# Patient Record
Sex: Female | Born: 1975
Health system: Southern US, Community
[De-identification: ages and names within clinical notes are randomized; demographics above are authoritative.]

## PROBLEM LIST (undated history)

## (undated) ENCOUNTER — Ambulatory Visit (HOSPITAL_COMMUNITY): Admission: EM | Disposition: A | Discharge status: Other Acute Inpatient Hospital | Payer: Self-pay

## (undated) DIAGNOSIS — K219 Gastro-esophageal reflux disease without esophagitis: Secondary | ICD-10-CM

## (undated) DIAGNOSIS — B1920 Unspecified viral hepatitis C without hepatic coma: Secondary | ICD-10-CM

## (undated) DIAGNOSIS — Q613 Polycystic kidney, unspecified: Secondary | ICD-10-CM

## (undated) DIAGNOSIS — M549 Dorsalgia, unspecified: Secondary | ICD-10-CM

## (undated) DIAGNOSIS — F1910 Other psychoactive substance abuse, uncomplicated: Secondary | ICD-10-CM

## (undated) DIAGNOSIS — T4145XA Adverse effect of unspecified anesthetic, initial encounter: Secondary | ICD-10-CM

## (undated) DIAGNOSIS — T8859XA Other complications of anesthesia, initial encounter: Secondary | ICD-10-CM

## (undated) DIAGNOSIS — G8929 Other chronic pain: Secondary | ICD-10-CM

## (undated) DIAGNOSIS — C539 Malignant neoplasm of cervix uteri, unspecified: Secondary | ICD-10-CM

## (undated) HISTORY — PX: NEPHRECTOMY: SHX65

---

## 1997-06-11 ENCOUNTER — Other Ambulatory Visit: Admission: RE | Admit: 1997-06-11 | Discharge: 1997-06-11 | Payer: Self-pay | Admitting: Obstetrics and Gynecology

## 1997-10-29 ENCOUNTER — Ambulatory Visit (HOSPITAL_COMMUNITY): Admission: RE | Admit: 1997-10-29 | Discharge: 1997-10-29 | Payer: Self-pay | Admitting: Obstetrics and Gynecology

## 1997-12-24 ENCOUNTER — Inpatient Hospital Stay (HOSPITAL_COMMUNITY): Admission: AD | Admit: 1997-12-24 | Discharge: 1997-12-24 | Payer: Self-pay | Admitting: Obstetrics and Gynecology

## 1998-01-01 ENCOUNTER — Inpatient Hospital Stay (HOSPITAL_COMMUNITY): Admission: AD | Admit: 1998-01-01 | Discharge: 1998-01-03 | Payer: Self-pay | Admitting: Obstetrics and Gynecology

## 1998-03-23 ENCOUNTER — Other Ambulatory Visit: Admission: RE | Admit: 1998-03-23 | Discharge: 1998-03-23 | Payer: Self-pay | Admitting: Obstetrics and Gynecology

## 2011-04-23 ENCOUNTER — Emergency Department (HOSPITAL_COMMUNITY)
Admission: EM | Admit: 2011-04-23 | Discharge: 2011-04-23 | Disposition: A | Discharge status: Home / Self Care | Payer: Self-pay | Source: Home / Self Care | Attending: Family Medicine | Admitting: Family Medicine

## 2011-04-23 ENCOUNTER — Encounter (HOSPITAL_COMMUNITY): Payer: Self-pay

## 2011-04-23 DIAGNOSIS — M5430 Sciatica, unspecified side: Secondary | ICD-10-CM

## 2011-04-23 HISTORY — DX: Malignant neoplasm of cervix uteri, unspecified: C53.9

## 2011-04-23 HISTORY — DX: Polycystic kidney, unspecified: Q61.3

## 2011-04-23 MED ORDER — TRIAMCINOLONE ACETONIDE 40 MG/ML IJ SUSP
40.0000 mg | Freq: Once | INTRAMUSCULAR | Status: AC
  Administered 2011-04-23: 40 mg via INTRAMUSCULAR

## 2011-04-23 MED ORDER — METHYLPREDNISOLONE ACETATE 80 MG/ML IJ SUSP
INTRAMUSCULAR | Status: AC
  Filled 2011-04-23: qty 1

## 2011-04-23 MED ORDER — METHYLPREDNISOLONE ACETATE 40 MG/ML IJ SUSP
80.0000 mg | Freq: Once | INTRAMUSCULAR | Status: AC
  Administered 2011-04-23: 80 mg via INTRAMUSCULAR

## 2011-04-23 MED ORDER — CYCLOBENZAPRINE HCL 5 MG PO TABS: 5.0000 mg | ORAL_TABLET | Freq: Three times a day (TID) | ORAL | Status: DC | PRN

## 2011-04-23 MED ORDER — METHYLPREDNISOLONE 4 MG PO KIT: PACK | ORAL | Status: AC

## 2011-04-23 MED ORDER — TRIAMCINOLONE ACETONIDE 40 MG/ML IJ SUSP
INTRAMUSCULAR | Status: AC
  Filled 2011-04-23: qty 5

## 2011-04-23 NOTE — ED Provider Notes (Signed)
History     CSN: [id number]  Arrival date & time 04/23/11  1244   First MD Initiated Contact with Patient 04/23/11 1337      Chief Complaint  Patient presents with  . Back Pain    lower back pain radiating down left leg    (Consider location/radiation/quality/duration/timing/severity/associated sxs/prior treatment) Patient is a 36 y.o. female presenting with back pain. The history is provided by the patient.  Back Pain  This is a new problem. The current episode started more than 1 week ago (2 week h/o pain since driving from [place].). The problem occurs constantly. The problem has been gradually worsening. The pain is present in the gluteal region and sacro-iliac joint. The quality of the pain is described as burning and shooting. The pain radiates to the left knee. The pain is moderate. The pain is the same all the time. Associated symptoms include leg pain. Pertinent negatives include no chest pain, no fever, no numbness, no abdominal pain and no dysuria.    Past Medical History  Diagnosis Date  . Polycystic kidney disease   . Cervical cancer     Past Surgical History  Procedure Date  . Nephrectomy   . Cesarean section     History reviewed. No pertinent family history.  History  Substance Use Topics  . Smoking status: Current Everyday Smoker  . Smokeless tobacco: Not on file  . Alcohol Use: Yes    OB History    Grav Para Term Preterm Abortions TAB SAB Ect Mult Living                  Review of Systems  Constitutional: Negative for fever.  Cardiovascular: Negative for chest pain.  Gastrointestinal: Negative.  Negative for abdominal pain.  Genitourinary: Negative for dysuria.  Musculoskeletal: Positive for back pain. Negative for joint swelling.  Neurological: Negative for numbness.    Allergies  Ibuprofen  Home Medications   Current Outpatient Rx  Name Route Sig Dispense Refill  . CYCLOBENZAPRINE HCL 5 MG PO TABS Oral Take 1 tablet (5 mg total) by  mouth 3 (three) times daily as needed for muscle spasms. 30 tablet 0  . METHYLPREDNISOLONE 4 MG PO KIT  follow package directions, start on Monday as directed until finished 21 tablet 0    BP 144/102  Pulse 92  Temp(Src) 97.4 F (36.3 C) (Oral)  Resp 16  SpO2 97%  Physical Exam  Nursing note and vitals reviewed. Constitutional: She is oriented to person, place, and time. She appears well-developed and well-nourished.  HENT:  Head: Normocephalic.  Abdominal: Soft. Bowel sounds are normal. She exhibits no mass. There is no tenderness. There is no rebound and no guarding.  Musculoskeletal: She exhibits tenderness.       Lumbar back: She exhibits tenderness and pain.       Back:  Neurological: She is alert and oriented to person, place, and time.  Skin: Skin is warm and dry. No rash noted.    ED Course  Procedures (including critical care time)  Labs Reviewed - No data to display No results found.   1. Sciatica neuralgia       MDM          Barkley Bruns, MD 04/23/11 1407

## 2011-04-23 NOTE — ED Notes (Signed)
Pt states she rode from [place] back to home 2 weeks ago and has had lower back pain which is getting progressively worse with pain radiating down left leg.

## 2011-05-01 ENCOUNTER — Encounter (HOSPITAL_COMMUNITY): Payer: Self-pay | Admitting: Emergency Medicine

## 2011-05-01 ENCOUNTER — Emergency Department (HOSPITAL_COMMUNITY)
Admission: EM | Admit: 2011-05-01 | Discharge: 2011-05-01 | Disposition: A | Discharge status: Home / Self Care | Payer: Self-pay | Attending: Emergency Medicine | Admitting: Emergency Medicine

## 2011-05-01 DIAGNOSIS — F17200 Nicotine dependence, unspecified, uncomplicated: Secondary | ICD-10-CM | POA: Insufficient documentation

## 2011-05-01 DIAGNOSIS — M549 Dorsalgia, unspecified: Secondary | ICD-10-CM | POA: Insufficient documentation

## 2011-05-01 DIAGNOSIS — M79609 Pain in unspecified limb: Secondary | ICD-10-CM | POA: Insufficient documentation

## 2011-05-01 DIAGNOSIS — G8929 Other chronic pain: Secondary | ICD-10-CM | POA: Insufficient documentation

## 2011-05-01 DIAGNOSIS — M545 Low back pain, unspecified: Secondary | ICD-10-CM | POA: Insufficient documentation

## 2011-05-01 HISTORY — DX: Dorsalgia, unspecified: M54.9

## 2011-05-01 HISTORY — DX: Other chronic pain: G89.29

## 2011-05-01 MED ORDER — HYDROCODONE-ACETAMINOPHEN 5-500 MG PO TABS: 1.0000 | ORAL_TABLET | Freq: Four times a day (QID) | ORAL | Status: AC | PRN

## 2011-05-01 MED ORDER — OXYCODONE-ACETAMINOPHEN 5-325 MG PO TABS
1.0000 | ORAL_TABLET | Freq: Once | ORAL | Status: AC
  Administered 2011-05-01: 1 via ORAL
  Filled 2011-05-01: qty 1

## 2011-05-01 MED ORDER — DIAZEPAM 5 MG PO TABS: 5.0000 mg | ORAL_TABLET | Freq: Three times a day (TID) | ORAL | Status: AC | PRN

## 2011-05-01 NOTE — ED Provider Notes (Signed)
History     CSN: [id number]  Arrival date & time 05/01/11  1232   First MD Initiated Contact with Patient 05/01/11 1427      Chief Complaint  Patient presents with  . Back Pain    (Consider location/radiation/quality/duration/timing/severity/associated sxs/prior treatment) Patient is a 36 y.o. female presenting with back pain. The history is provided by the patient.  Back Pain  This is a chronic problem. Episode onset: over a year ago. The problem occurs constantly. The problem has been gradually worsening. Pertinent negatives include no fever.  Pt with chronic back pain radiating into left leg. Pain for over a year. Denies recent injuries. States pain same as prior. Has been managed by pain clinic in [place]. States that she just recently moved here about 3wks ago and has no more medications left. No fever,chills, LE weakness or numbness. Was seen at [facility], given flexeril and ibuprofen, no relief.   Past Medical History  Diagnosis Date  . Polycystic kidney disease   . Cervical cancer   . Chronic back pain     Past Surgical History  Procedure Date  . Nephrectomy   . Cesarean section     History reviewed. No pertinent family history.  History  Substance Use Topics  . Smoking status: Current Everyday Smoker  . Smokeless tobacco: Not on file  . Alcohol Use: Yes    OB History    Grav Para Term Preterm Abortions TAB SAB Ect Mult Living                  Review of Systems  Constitutional: Negative for fever and chills.  HENT: Negative.   Eyes: Negative.   Respiratory: Negative.   Cardiovascular: Negative.   Genitourinary: Negative.   Musculoskeletal: Positive for back pain. Negative for joint swelling and gait problem.  Skin: Negative.   Neurological: Negative.   Psychiatric/Behavioral: Negative.     Allergies  Review of patient's allergies indicates no active allergies.  Home Medications   Current Outpatient Rx  Name Route Sig Dispense Refill  . IBUPROFEN  200 MG PO TABS Oral Take 800 mg by mouth 2 (two) times daily. For pain    . DIAZEPAM 5 MG PO TABS Oral Take 1 tablet (5 mg total) by mouth every 8 (eight) hours as needed. 15 tablet 0  . HYDROCODONE-ACETAMINOPHEN 5-500 MG PO TABS Oral Take 1-2 tablets by mouth every 6 (six) hours as needed for pain. 15 tablet 0  . METHYLPREDNISOLONE 4 MG PO KIT  follow package directions, start on Monday as directed until finished 21 tablet 0    BP 140/95  Pulse 92  Temp(Src) 97.2 F (36.2 C) (Oral)  Resp 22  SpO2 100%  Physical Exam  Nursing note and vitals reviewed. Constitutional: She is oriented to person, place, and time. She appears well-developed and well-nourished. No distress.  HENT:  Head: Normocephalic and atraumatic.  Eyes: Conjunctivae are normal.  Neck: Neck supple.  Cardiovascular: Normal rate, regular rhythm and normal heart sounds.   Pulmonary/Chest: Effort normal and breath sounds normal. No respiratory distress. She has no wheezes.  Abdominal: Soft. Bowel sounds are normal. She exhibits no distension.  Musculoskeletal: Normal range of motion.       mdline lumbar spine tenderness with palpation. Appears normal. Paraspinal tenderness. Pain with left straight leg raise.   Neurological: She is alert and oriented to person, place, and time.       2+ and equal patellar reflexes bilaterally. 5/5 and equal lower extremity  strength bilaterally. Pt able to dorsiflex bilateral feet and great toes. Normal gait  Skin: Skin is warm and dry.  Psychiatric: She has a normal mood and affect.    ED Course  Procedures (including critical care time)  Chronic pain. Spoke and received records from pt's doctors in [place]. Pt has been managed by pain clinic there, treated with Methadone  10mg , Neurontin 300mg , oxycodone 30mg , Morphine ER 30mg . Explained to pt that we will not be prescribing these medications and she should find a primary care doctor or pain specialist.   1. Chronic back pain        MDM          Lottie Mussel, PA 05/01/11 1531

## 2011-05-01 NOTE — ED Provider Notes (Signed)
Medical screening examination/treatment/procedure(s) were performed by non-physician practitioner and as supervising physician I was immediately available for consultation/collaboration.   Rolan Bucco, MD 05/01/11 [phone]

## 2011-05-01 NOTE — Discharge Instructions (Signed)
It is important that you find a doctor that will manage your pain here in [place]. Take vicodin ad valium as prescribed for pain. Follow up with specialist or a pain clinic here.   RESOURCE GUIDE  Dental Problems  Patients with Medicaid: [facility] [phone] [address]                                           [phone] [address] Phone:  [phone]                                                  Phone:  [phone]  If unable to pay or uninsured, contact:  [facility] or [facility]. to become qualified for the adult dental clinic.  Chronic Pain Problems Contact [facility]  [phone] Patients need to be referred by their primary care doctor.  Insufficient Money for Medicine Contact [employer]:  call "211" or [facility] (204)[phone].  No Primary Care Doctor Call [facility]  [phone] Other agencies that provide inexpensive medical care    [facility]  [phone]    [facility]  [phone]    [facility]  [phone]    [facility]  [phone]    [facility]  [phone]    [facility]  [phone]  Psychological Services [facility]  [phone] [facility]  [phone] [facility]   [phone] (emergency services [phone])  Substance Abuse Resources [facility]  [phone] [facility] [phone] [facility] [phone] Floydene Flock [phone] Residential & Outpatient Substance Abuse Program  [phone]  Abuse/Neglect [facility] Child Abuse Hotline 4426640727 [facility] Child Abuse Hotline 9168437078 (After Hours)  Emergency Shelter [facility] [phone]  Maternity Homes [facility] [place] [facility] 442-838-1318 [facility] [phone]  MRSA Hotline #:   7044696044    [facility]  Resources  [facility] [place]     [employer]                          [facility] [address] [place]                       [address]      [address]  [place]                                                Cristobal Goldmann Phone:  [phone]  Phone:  [phone]                 Phone:  [phone]  [facility] Phone:  [phone]  [facility] Child Abuse Hotline (806) 124-6924 (628) [phone] (After Hours)    Back Pain, Adult Low back pain is very common. About 1 in 5 people have back pain.The cause of low back pain is rarely dangerous. The pain often gets better over time.About half of people with a sudden onset of back pain feel better in just 2 weeks. About 8 in 10 people feel better by 6 weeks.  CAUSES Some common causes of back pain include:  Strain of the muscles or ligaments supporting the spine.   Wear and tear (degeneration) of the spinal discs.   Arthritis.   Direct injury to the back.  DIAGNOSIS Most of the time, the direct cause of low back pain is not known.However, back pain can be treated effectively even when the exact cause of the pain is unknown.Answering your caregiver's questions about your overall health and symptoms is one of the most accurate ways to make sure the cause of your pain is not dangerous. If your caregiver needs more information, he or she may order lab work or imaging tests (X-rays or MRIs).However, even if imaging tests show changes in your back, this usually does not require surgery. HOME CARE INSTRUCTIONS For many people, back pain returns.Since low back pain is rarely dangerous, it is often a condition that people can learn to [facility] their own.   Remain active. It is stressful on the back to sit or stand in one place. Do not sit, drive, or stand in one place for more than 30 minutes at a time. Take short  walks on level surfaces as soon as pain allows.Try to increase the length of time you walk each day.   Do not stay in bed.Resting more than 1 or 2 days can delay your recovery.   Do not avoid exercise or work.Your body is made to move.It is not dangerous to be active, even though your back may hurt.Your back will likely heal faster if you return to being active before your pain is gone.   Pay attention to your body when you bend and lift. Many people have less discomfortwhen lifting if they bend their knees, keep the load close to their bodies,and avoid twisting. Often, the most comfortable positions are those that put less stress on your recovering back.   Find a comfortable position to sleep. Use a firm mattress and lie on your side with your knees slightly bent. If you lie on your back, put a pillow under your knees.   Only take over-the-counter or prescription medicines as directed by your caregiver. Over-the-counter medicines to reduce pain and inflammation are often the most helpful.Your caregiver may prescribe muscle relaxant drugs.These medicines help dull your pain so you can more quickly return to your normal activities and healthy exercise.   Put ice on the injured area.   Put ice in a plastic bag.   Place a towel between your skin and the bag.   Leave the ice on for 15 to 20 minutes, 3 to 4 times a day for the first 2 to 3 days. After that, ice and heat may be alternated to reduce pain and spasms.   Ask your caregiver about trying back exercises and gentle massage. This may be of some benefit.   Avoid feeling anxious or stressed.Stress increases muscle tension and  can worsen back pain.It is important to recognize when you are anxious or stressed and learn ways to manage it.Exercise is a great option.  SEEK MEDICAL CARE IF:  You have pain that is not relieved with rest or medicine.   You have pain that does not improve in 1 week.   You have new symptoms.   You  are generally not feeling well.  SEEK IMMEDIATE MEDICAL CARE IF:   You have pain that radiates from your back into your legs.   You develop new bowel or bladder control problems.   You have unusual weakness or numbness in your arms or legs.   You develop nausea or vomiting.   You develop abdominal pain.   You feel faint.  Document Released: 02/27/2005 Document Revised: 11/09/2010 Document Reviewed: 07/18/2010 [facility] Patient Information 2012 [place], [place].

## 2011-05-01 NOTE — ED Notes (Signed)
Pt c/o lower back pain x several days since taking a long car ride; pt sts radiation down left leg; pt sts hx of similar

## 2014-07-24 ENCOUNTER — Observation Stay (HOSPITAL_COMMUNITY)
Admission: EM | Admit: 2014-07-24 | Discharge: 2014-07-25 | Disposition: A | Discharge status: Home / Self Care | Payer: Self-pay | Attending: Family Medicine | Admitting: Family Medicine

## 2014-07-24 ENCOUNTER — Encounter (HOSPITAL_COMMUNITY): Payer: Self-pay | Admitting: Family Medicine

## 2014-07-24 ENCOUNTER — Emergency Department (HOSPITAL_COMMUNITY): Payer: Self-pay

## 2014-07-24 DIAGNOSIS — F17210 Nicotine dependence, cigarettes, uncomplicated: Secondary | ICD-10-CM | POA: Insufficient documentation

## 2014-07-24 DIAGNOSIS — R072 Precordial pain: Principal | ICD-10-CM | POA: Diagnosis present

## 2014-07-24 DIAGNOSIS — K219 Gastro-esophageal reflux disease without esophagitis: Secondary | ICD-10-CM

## 2014-07-24 DIAGNOSIS — Z720 Tobacco use: Secondary | ICD-10-CM

## 2014-07-24 DIAGNOSIS — Z905 Acquired absence of kidney: Secondary | ICD-10-CM | POA: Insufficient documentation

## 2014-07-24 DIAGNOSIS — Z886 Allergy status to analgesic agent status: Secondary | ICD-10-CM | POA: Insufficient documentation

## 2014-07-24 DIAGNOSIS — R079 Chest pain, unspecified: Secondary | ICD-10-CM

## 2014-07-24 DIAGNOSIS — I10 Essential (primary) hypertension: Secondary | ICD-10-CM

## 2014-07-24 DIAGNOSIS — Z8541 Personal history of malignant neoplasm of cervix uteri: Secondary | ICD-10-CM | POA: Insufficient documentation

## 2014-07-24 DIAGNOSIS — R0602 Shortness of breath: Secondary | ICD-10-CM

## 2014-07-24 DIAGNOSIS — Z8249 Family history of ischemic heart disease and other diseases of the circulatory system: Secondary | ICD-10-CM | POA: Insufficient documentation

## 2014-07-24 HISTORY — DX: Gastro-esophageal reflux disease without esophagitis: K21.9

## 2014-07-24 HISTORY — DX: Adverse effect of unspecified anesthetic, initial encounter: T41.45XA

## 2014-07-24 HISTORY — DX: Unspecified viral hepatitis C without hepatic coma: B19.20

## 2014-07-24 HISTORY — DX: Other complications of anesthesia, initial encounter: T88.59XA

## 2014-07-24 LAB — BASIC METABOLIC PANEL
Anion gap: 9 (ref 5–15)
BUN: 10 mg/dL (ref 6–20)
CO2: 29 mmol/L (ref 22–32)
CREATININE: 1.05 mg/dL — AB (ref 0.44–1.00)
Calcium: 9.6 mg/dL (ref 8.9–10.3)
Chloride: 105 mmol/L (ref 101–111)
GFR calc Af Amer: >60 mL/min (ref >60)
GFR calc non Af Amer: >60 mL/min (ref >60)
Glucose, Bld: 88 mg/dL (ref 65–99)
POTASSIUM: 4.5 mmol/L (ref 3.5–5.1)
SODIUM: 143 mmol/L (ref 135–145)

## 2014-07-24 LAB — CBC WITH DIFFERENTIAL/PLATELET
Basophils Absolute: 0 10*3/uL (ref 0.0–0.1)
Basophils Relative: 0 % (ref 0–1)
EOS ABS: 0.3 10*3/uL (ref 0.0–0.7)
EOS PCT: 2 % (ref 0–5)
HCT: 44 % (ref 36.0–46.0)
Hemoglobin: 14.5 g/dL (ref 12.0–15.0)
LYMPHS PCT: 34 % (ref 12–46)
Lymphs Abs: 3.9 10*3/uL (ref 0.7–4.0)
MCH: 28.7 pg (ref 26.0–34.0)
MCHC: 33 g/dL (ref 30.0–36.0)
MCV: 87.1 fL (ref 78.0–100.0)
MONO ABS: 0.5 10*3/uL (ref 0.1–1.0)
Monocytes Relative: 4 % (ref 3–12)
Neutro Abs: 6.7 10*3/uL (ref 1.7–7.7)
Neutrophils Relative %: 60 % (ref 43–77)
Platelets: 278 10*3/uL (ref 150–400)
RBC: 5.05 MIL/uL (ref 3.87–5.11)
RDW: 14 % (ref 11.5–15.5)
WBC: 11.5 10*3/uL — AB (ref 4.0–10.5)

## 2014-07-24 LAB — I-STAT CHEM 8, ED
BUN: 16 mg/dL (ref 6–20)
CALCIUM ION: 1.14 mmol/L (ref 1.12–1.23)
CREATININE: 1.1 mg/dL — AB (ref 0.44–1.00)
Chloride: 104 mmol/L (ref 101–111)
GLUCOSE: 131 mg/dL — AB (ref 65–99)
HEMATOCRIT: 46 % (ref 36.0–46.0)
HEMOGLOBIN: 15.6 g/dL — AB (ref 12.0–15.0)
POTASSIUM: 5.3 mmol/L — AB (ref 3.5–5.1)
Sodium: 139 mmol/L (ref 135–145)
TCO2: 26 mmol/L (ref 0–100)

## 2014-07-24 LAB — PROTIME-INR
INR: 1 (ref 0.00–1.49)
Prothrombin Time: 13.3 seconds (ref 11.6–15.2)

## 2014-07-24 LAB — I-STAT TROPONIN, ED: TROPONIN I, POC: 0 ng/mL (ref 0.00–0.08)

## 2014-07-24 LAB — TROPONIN I
Troponin I: <0.03 ng/mL (ref <0.031)
Troponin I: <0.03 ng/mL (ref <0.031)

## 2014-07-24 LAB — APTT: aPTT: 30 seconds (ref 24–37)

## 2014-07-24 MED ORDER — NICOTINE 21 MG/24HR TD PT24
21.0000 mg | MEDICATED_PATCH | Freq: Every day | TRANSDERMAL | Status: DC
  Administered 2014-07-24 – 2014-07-25 (×2): 21 mg via TRANSDERMAL
  Filled 2014-07-24 (×2): qty 1

## 2014-07-24 MED ORDER — AMLODIPINE BESYLATE 5 MG PO TABS
5.0000 mg | ORAL_TABLET | Freq: Every day | ORAL | Status: DC
  Administered 2014-07-24: 5 mg via ORAL
  Filled 2014-07-24 (×2): qty 1

## 2014-07-24 MED ORDER — PANTOPRAZOLE SODIUM 40 MG PO TBEC
40.0000 mg | DELAYED_RELEASE_TABLET | Freq: Every day | ORAL | Status: DC
  Administered 2014-07-24 – 2014-07-25 (×2): 40 mg via ORAL
  Filled 2014-07-24: qty 1

## 2014-07-24 MED ORDER — ACETAMINOPHEN 650 MG RE SUPP: 650.0000 mg | Freq: Four times a day (QID) | RECTAL | Status: DC | PRN

## 2014-07-24 MED ORDER — METHADONE HCL 5 MG PO TABS: 220.0000 mg | ORAL_TABLET | Freq: Every day | ORAL | Status: DC

## 2014-07-24 MED ORDER — ACETAMINOPHEN 325 MG PO TABS
650.0000 mg | ORAL_TABLET | Freq: Four times a day (QID) | ORAL | Status: DC | PRN
  Administered 2014-07-24 (×2): 650 mg via ORAL
  Filled 2014-07-24 (×2): qty 2

## 2014-07-24 MED ORDER — METOPROLOL TARTRATE 1 MG/ML IV SOLN
5.0000 mg | Freq: Once | INTRAVENOUS | Status: AC
  Administered 2014-07-24: 5 mg via INTRAVENOUS
  Filled 2014-07-24: qty 5

## 2014-07-24 MED ORDER — MORPHINE SULFATE 2 MG/ML IJ SOLN: 2.0000 mg | INTRAMUSCULAR | Status: DC | PRN

## 2014-07-24 MED ORDER — LISINOPRIL 10 MG PO TABS
10.0000 mg | ORAL_TABLET | Freq: Once | ORAL | Status: AC
  Administered 2014-07-24: 10 mg via ORAL
  Filled 2014-07-24: qty 1

## 2014-07-24 MED ORDER — ONDANSETRON HCL 4 MG/2ML IJ SOLN: 4.0000 mg | Freq: Four times a day (QID) | INTRAMUSCULAR | Status: DC | PRN

## 2014-07-24 MED ORDER — ASPIRIN EC 325 MG PO TBEC
325.0000 mg | DELAYED_RELEASE_TABLET | Freq: Every day | ORAL | Status: DC
  Administered 2014-07-25: 325 mg via ORAL
  Filled 2014-07-24: qty 1

## 2014-07-24 MED ORDER — SODIUM CHLORIDE 0.9 % IV SOLN
Freq: Once | INTRAVENOUS | Status: AC
  Administered 2014-07-24: 11:00:00 via INTRAVENOUS

## 2014-07-24 MED ORDER — SODIUM CHLORIDE 0.9 % IJ SOLN
3.0000 mL | Freq: Two times a day (BID) | INTRAMUSCULAR | Status: DC
  Administered 2014-07-24 – 2014-07-25 (×3): 3 mL via INTRAVENOUS

## 2014-07-24 MED ORDER — GI COCKTAIL ~~LOC~~
30.0000 mL | Freq: Once | ORAL | Status: AC
  Administered 2014-07-24: 30 mL via ORAL
  Filled 2014-07-24: qty 30

## 2014-07-24 MED ORDER — ENOXAPARIN SODIUM 40 MG/0.4ML ~~LOC~~ SOLN
40.0000 mg | SUBCUTANEOUS | Status: DC
  Administered 2014-07-24: 40 mg via SUBCUTANEOUS
  Filled 2014-07-24 (×2): qty 0.4

## 2014-07-24 MED ORDER — METHADONE HCL 10 MG PO TABS
220.0000 mg | ORAL_TABLET | Freq: Every day | ORAL | Status: DC
  Administered 2014-07-25: 220 mg via ORAL
  Filled 2014-07-24: qty 22

## 2014-07-24 MED ORDER — NITROGLYCERIN 0.4 MG SL SUBL
0.4000 mg | SUBLINGUAL_TABLET | SUBLINGUAL | Status: DC | PRN
  Administered 2014-07-24 (×2): 0.4 mg via SUBLINGUAL
  Filled 2014-07-24: qty 1

## 2014-07-24 MED ORDER — ONDANSETRON HCL 4 MG PO TABS: 4.0000 mg | ORAL_TABLET | Freq: Four times a day (QID) | ORAL | Status: DC | PRN

## 2014-07-24 MED ORDER — ASPIRIN 81 MG PO CHEW
324.0000 mg | CHEWABLE_TABLET | Freq: Once | ORAL | Status: AC
  Administered 2014-07-24: 324 mg via ORAL
  Filled 2014-07-24: qty 4

## 2014-07-24 MED ORDER — METHADONE HCL 10 MG/ML PO CONC: 220.0000 mg | Freq: Every day | ORAL | Status: DC

## 2014-07-24 NOTE — H&P (Signed)
History and Physical  Felicia Hendricks HAL:[id number] DOB: 02-23-1976 DOA: 07/24/2014   PCP: Pcp Not In System  Referring Physician: ED/ Dr. 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 The patient describes the pain as a pressure sensation on the substernal and left-sided chest area. It is associated with mostly rest and occasionally with activity. She denies any shortness of breath, dizziness, syncope, diaphoresis. She has had some intermittent nausea. The patient became concerned when she began having radiation down to her left arm and having numbness and tingling in her bilateral hands. She went to see her primary care physician on 07/23/2014, and at that time her systolic blood pressure was noted in the 240s. The patient was told to go to the emergency department, but refused at the time. Because of persistent chest discomfort, she presented to the emergency department on 07/24/2014. At the time of presentation, the patient was noted to have a blood pressure of 201/102. The patient was given sublingual nitroglycerin 2 and intravenous metoprolol with improvement of her blood pressure as well as her chest discomfort. The patient is afebrile and hemodynamically stable with oxygen saturation 97-100 percent on room air. She continues to smoke 1-1/2 packs per day. She states that her dad had myocardial infarctions in his early 75s. She denies any current illicit drug use. She receives daily methadone from [place]. In addition, the patient has been taking up to 16 pills of ibuprofen daily for the past 2-3 weeks. In emergency department, the patient was afebrile and dynamically stable. WBC was 11.5 with hemoglobin 14.5, platelets 278,000. Point of care troponin show potassium 5.3, creatinine 1.10. EKG showed sinus rhythm with  nonspecific ST changes. Chest x-ray was negative for any acute findings. Assessment/Plan: Atypical chest pain -The patient's chest pain is atypical by clinical history -Likely also contributed by her uncontrolled blood pressure -HEART score is 3 -EKG without concerning ischemic changes -Cycle troponins -Also suspect that her atypical chest pain may be related to a GI source as the patient has taken excessive amounts of ibuprofen and using over-the-counter acid reducer- Hypertensive urgency -The patient was previously diagnosed with hypertension over 10 years ago and started on lisinopril -Stated that when she had nephrectomy her blood pressure improved and stopped taking lisinopril -Has not had physician follow-up for many years. -Start amlodipine -If urine drug screen is negative for illicit substances, patient can be restarted on beta blocker -Check urine drug screen The patient's elevated blood pressure may also been achievable to her excessive ibuprofen use in recent weeks- Tobacco abuse -NicoDerm patch -Cessation discussed Questionable hyperkalemia -This was noted on a point of care BMP which may intermittently be inaccurate--will recheck -check serum BMP History of heroin use -Continue methadone dosing GERD -Patient was taking over-the-counter acid reducer -Give the patient GI cocktail and observe for clinical response  -Start PPI  history of nephrectomy  -Secondary to polycystic kidney disease        Past Medical History  Diagnosis Date  . Polycystic kidney disease   . Cervical cancer   . Chronic back pain   . Hepatitis C    Past Surgical History  Procedure Laterality Date  . Nephrectomy    . Cesarean section     Social History:  reports that she has been smoking.  She does not have  any smokeless tobacco history on file. She reports that she drinks alcohol. She reports that she does not use illicit drugs.   History reviewed. No pertinent family  history.Father is deceased from heart failure--had myocardial infarction in the early 58s. Mother had myocardial infarction at age 70   Allergies  Allergen Reactions  . Aspirin     Kidney disease       Prior to Admission medications   Medication Sig Start Date End Date Taking? Authorizing Provider  ibuprofen (ADVIL,MOTRIN) 200 MG tablet Take 800 mg by mouth 2 (two) times daily. Take every day per patient For pain   Yes Historical Provider, MD  METHADONE HCL PO Take 1 tablet by mouth daily. 220mg    Yes Historical Provider, MD  oxymetazoline (AFRIN) 0.05 % nasal spray Place 1 spray into both nostrils 2 (two) times daily as needed for congestion.   Yes Historical Provider, MD    Review of Systems:  Constitutional:  No weight loss, night sweats, Fevers, chills, fatigue.  Head&Eyes: No headache.  No vision loss.  No eye pain or scotoma ENT:  No Difficulty swallowing,Tooth/dental problems,Sore throat,   Cardio-vascular:  No Orthopnea, PND, swelling in lower extremities,  dizziness, palpitations  GI:  No  abdominal pain,diarrhea, loss of appetite, hematochezia, melena, heartburn, indigestion, Resp:  No shortness of breath with exertion or at rest. No cough. No coughing up of blood .No wheezing.No chest wall deformity  Skin:  no rash or lesions.  GU:  no dysuria, change in color of urine, no urgency or frequency. No flank pain.  Musculoskeletal:  No joint pain or swelling. No decreased range of motion. No back pain.  Psych:  No change in mood or affect. No depression or anxiety. Neurologic: No  no dysesthesia, no focal weakness, no vision loss. No syncope  Physical Exam: Filed Vitals:   07/24/14 1200 07/24/14 1215 07/24/14 1224 07/24/14 1230  BP: 149/89 191/100 176/97 182/99  Pulse: 48 48 50 54  Temp:      Resp: 14 10 14 12   Height:      Weight:      SpO2: 97% 98% 98% 99%   General:  A&O x 3, NAD, nontoxic, pleasant/cooperative Head/Eye: No conjunctival hemorrhage, no  icterus, [place]/AT, No nystagmus ENT:  No icterus,  No thrush, good dentition, no pharyngeal exudate Neck:  No masses, no lymphadenpathy, no bruits CV:  RRR, no rub, no gallop, no S3 Lung:  CTAB, good air movement, no wheeze, no rhonchi Abdomen: soft/NT, +BS, nondistended, no peritoneal signs Ext: No cyanosis, No rashes, No petechiae, No lymphangitis, No edema   Labs on Admission:  Basic Metabolic Panel:  Recent Labs Lab 07/24/14 1039  NA 139  K 5.3*  CL 104  GLUCOSE 131*  BUN 16  CREATININE 1.10*   Liver Function Tests: No results for input(s): AST, ALT, ALKPHOS, BILITOT, PROT, ALBUMIN in the last 168 hours. No results for input(s): LIPASE, AMYLASE in the last 168 hours. No results for input(s): AMMONIA in the last 168 hours. CBC:  Recent Labs Lab 07/24/14 1030 07/24/14 1039  WBC 11.5*  --   NEUTROABS 6.7  --   HGB 14.5 15.6*  HCT 44.0 46.0  MCV 87.1  --   PLT 278  --    Cardiac Enzymes: No results for input(s): CKTOTAL, CKMB, CKMBINDEX, TROPONINI in the last 168 hours. BNP: Invalid input(s): POCBNP CBG: No results for input(s): GLUCAP in the last 168 hours.  Radiological Exams on Admission: Dg Chest 2  View  07/24/2014   CLINICAL DATA:  Chest pain.  history of cervical cancer hepatitis-C  EXAM: CHEST  2 VIEW  COMPARISON:  None.  FINDINGS: Normal mediastinum and cardiac silhouette. Normal pulmonary vasculature. No evidence of effusion, infiltrate, or pneumothorax. Compression deformity of the superior endplate of the L1 vertebral body with approximately 25% loss of vertebral body height.  IMPRESSION: 1.  No acute cardiopulmonary process. 2. Age-indeterminate endplate compression fracture at L1.  AA   Electronically Signed   By: Suzy Bouchard M.D.   On: 07/24/2014 11:46    EKG: Independently reviewed. Sinus rhythm, nonspecific ST changes    Time spent:60 minutes Code Status:   FULL Family Communication:   Aunt at bedside   Netta Fodge, DO  [facility] Pager [phone]  If 7PM-7AM, please contact night-coverage [email or web address] Password [facility] 07/24/2014, 12:42 PM

## 2014-07-24 NOTE — ED Provider Notes (Signed)
CSN: [id number]     Arrival date & time 07/24/14  4854 History   First MD Initiated Contact with Patient 07/24/14 1005     Chief Complaint  Patient presents with  . Hypertension  . 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 She was hypertensive with her pregnancies in the past, she has avoided physicians but recently at the methadone clinic she was told she needed to see the doctor because of persistent hypertension, she went to the doctor last night because generally at the insistence of her mother, they told her to come to the hospital immediately for evaluation due to severe persistent hypertension and ongoing chest pain. She describes her chest pain as a heaviness, middle of the chest, left side of the chest with radiation to the left shoulder, this is sometimes exertional but sometimes it comes on her breast, currently it is mild and not associated with shortness of breath nausea vomiting diaphoresis or swelling of the lower extremities. She has not been taking any blood pressure medications, she takes over-the-counter ibuprofen as well as over-the-counter antacid medications. She still smokes cigarettes, there is a strong family history of heart disease. She has never been evaluated for any chest pain in the past. She did not go to the emergency department last night, she waited until this morning and at the insistence of her mother presents for evaluation.  She reports blood pressures of between 240 and 260/120 yesterday.  Patient is a 39 y.o. female presenting with hypertension and chest pain. The history is provided by the patient and a relative.  Hypertension Associated symptoms include chest pain.  Chest  Pain   Past Medical History  Diagnosis Date  . Polycystic kidney disease   . Cervical cancer   . Chronic back pain   . Hepatitis C    Past Surgical History  Procedure Laterality Date  . Nephrectomy    . Cesarean section     History reviewed. No pertinent family history. History  Substance Use Topics  . Smoking status: Current Every Day Smoker  . Smokeless tobacco: Not on file  . Alcohol Use: Yes   OB History    No data available     Review of Systems  Cardiovascular: Positive for chest pain.  All other systems reviewed and are negative.     Allergies  Aspirin  Home Medications   Prior to Admission medications   Medication Sig Start Date End Date Taking? Authorizing Provider  ibuprofen (ADVIL,MOTRIN) 200 MG tablet Take 800 mg by mouth 2 (two) times daily. Take every day per patient For pain   Yes Historical Provider, MD  METHADONE HCL PO Take 1 tablet by mouth daily. 220mg    Yes Historical Provider, MD  oxymetazoline (AFRIN) 0.05 % nasal spray Place 1 spray into both nostrils 2 (two) times daily as needed for congestion.   Yes Historical Provider, MD   BP 138/106 mmHg  Pulse 52  Temp(Src) 97.6 F (36.4 C)  Resp 14  Ht 5\' 5"  (1.651 m)  Wt 210 lb (95.255 kg)  BMI 34.95 kg/m2  SpO2 97% Physical Exam  Constitutional: She appears well-developed and well-nourished. No distress.  HENT:  Head: Normocephalic and atraumatic.  Mouth/Throat: Oropharynx is clear and moist. No oropharyngeal exudate.  Eyes: Conjunctivae and  EOM are normal. Pupils are equal, round, and reactive to light. Right eye exhibits no discharge. Left eye exhibits no discharge. No scleral icterus.  Neck: Normal range of motion. Neck supple. No JVD present. No thyromegaly present.  Cardiovascular: Normal rate, regular rhythm, normal heart sounds and intact distal pulses.  Exam reveals no gallop and no friction rub.   No murmur heard. Pulmonary/Chest: Effort normal and breath sounds normal. No  respiratory distress. She has no wheezes. She has no rales.  Abdominal: Soft. Bowel sounds are normal. She exhibits no distension and no mass. There is no tenderness.  Musculoskeletal: Normal range of motion. She exhibits no edema or tenderness.  Lymphadenopathy:    She has no cervical adenopathy.  Neurological: She is alert. Coordination normal.  Skin: Skin is warm and dry. No rash noted. No erythema.  Psychiatric: She has a normal mood and affect. Her behavior is normal.  Nursing note and vitals reviewed.   ED Course  Procedures (including critical care time) Labs Review Labs Reviewed  CBC WITH DIFFERENTIAL/PLATELET - Abnormal; Notable for the following:    WBC 11.5 (*)    All other components within normal limits  I-STAT CHEM 8, ED - Abnormal; Notable for the following:    Potassium 5.3 (*)    Creatinine, Ser 1.10 (*)    Glucose, Bld 131 (*)    Hemoglobin 15.6 (*)    All other components within normal limits  PROTIME-INR  APTT  I-STAT TROPOININ, ED    Imaging Review Dg Chest 2 View  07/24/2014   CLINICAL DATA:  Chest pain.  history of cervical cancer hepatitis-C  EXAM: CHEST  2 VIEW  COMPARISON:  None.  FINDINGS: Normal mediastinum and cardiac silhouette. Normal pulmonary vasculature. No evidence of effusion, infiltrate, or pneumothorax. Compression deformity of the superior endplate of the L1 vertebral body with approximately 25% loss of vertebral body height.  IMPRESSION: 1.  No acute cardiopulmonary process. 2. Age-indeterminate endplate compression fracture at L1.  AA   Electronically Signed   By: Suzy Bouchard M.D.   On: 07/24/2014 11:46     EKG Interpretation   Date/Time:  Friday Jul 24 2014 10:43:17 EDT Ventricular Rate:  68 PR Interval:  154 QRS Duration: 73 QT Interval:  457 QTC Calculation: 486 R Axis:   24 Text Interpretation:  Sinus rhythm Consider left atrial enlargement  Probable anterolateral infarct, old Abnormal ekg No old tracing to compare   Confirmed by Saif Peter  MD, [place] ([id number]) on 07/24/2014 10:56:59 AM      MDM   Final diagnoses:  SOB (shortness of breath)  Essential hypertension  Chest pain, unspecified chest pain type    At this time the patient's blood pressure is severely elevated. Her heart and lung sounds are normal, she has no peripheral edema, she has a solitary left kidney, we'll check renal function, evaluate for cardiac involvement related to blood pressure and chest pain, anticipate admission to the hospital due to severe uncontrolled hypertension and ongoing chest pain. She has multiple risk factors for heart disease. EKG from the office from last night was reviewed, no signs of acute ischemia at that time.  Labs neg, BP improved with meds as below - needs CP r/o  Meds given in ED:  Medications  nitroGLYCERIN (NITROSTAT) SL tablet 0.4 mg (0.4 mg Sublingual Given 07/24/14 1055)  lisinopril (PRINIVIL,ZESTRIL) tablet 10 mg (10 mg Oral Given 07/24/14 1040)  metoprolol (LOPRESSOR) injection 5 mg (5 mg Intravenous Given 07/24/14 1047)  aspirin  chewable tablet 324 mg (324 mg Oral Given 07/24/14 1040)  0.9 %  sodium chloride infusion ( Intravenous New Bag/Given 07/24/14 1047)    New Prescriptions   No medications on file       Noemi Chapel, MD 07/24/14 1202

## 2014-07-24 NOTE — ED Notes (Addendum)
Pt refusing 3rd NTG SL d/t headache.  MD Sabra Heck aware.

## 2014-07-24 NOTE — Progress Notes (Signed)
Noted pt's HR 41 SB.  Pt asymptomatic. Notified  Dr. Carles Collet .  Instructed to obtain VS BP 144/95, p48.  And also EKG - marked SB,.  Cannot rule out inferior infarct, age undetermined.    Dr. Carles Collet made aware.  Will continue to monitor.

## 2014-07-24 NOTE — ED Notes (Signed)
Attempted report 

## 2014-07-24 NOTE — ED Notes (Signed)
Pt here for weeks of hypertension, chest pain. Sent here by doctor for further work up.

## 2014-07-24 NOTE — Progress Notes (Signed)
At 1300 pt arrived from ED via stretcher to floor.  Denies pain/discomfort.  A&0x4.  Family members at her side.  Oriented to surroundings.  Call bell at reach.  Instructed to call for assistance.  Verbalized understanding.  Will continue to monitor.  Karie Kirks, Therapist, sports.

## 2014-07-25 DIAGNOSIS — R0789 Other chest pain: Secondary | ICD-10-CM

## 2014-07-25 LAB — BASIC METABOLIC PANEL
Anion gap: 10 (ref 5–15)
BUN: 17 mg/dL (ref 6–20)
CHLORIDE: 104 mmol/L (ref 101–111)
CO2: 25 mmol/L (ref 22–32)
CREATININE: 1 mg/dL (ref 0.44–1.00)
Calcium: 8.7 mg/dL — ABNORMAL LOW (ref 8.9–10.3)
Glucose, Bld: 117 mg/dL — ABNORMAL HIGH (ref 65–99)
POTASSIUM: 3.8 mmol/L (ref 3.5–5.1)
Sodium: 139 mmol/L (ref 135–145)

## 2014-07-25 LAB — CBC
HCT: 37.3 % (ref 36.0–46.0)
HEMOGLOBIN: 12.5 g/dL (ref 12.0–15.0)
MCH: 29.1 pg (ref 26.0–34.0)
MCHC: 33.5 g/dL (ref 30.0–36.0)
MCV: 86.9 fL (ref 78.0–100.0)
PLATELETS: 251 10*3/uL (ref 150–400)
RBC: 4.29 MIL/uL (ref 3.87–5.11)
RDW: 14.2 % (ref 11.5–15.5)
WBC: 9.5 10*3/uL (ref 4.0–10.5)

## 2014-07-25 LAB — TROPONIN I: Troponin I: <0.03 ng/mL (ref <0.031)

## 2014-07-25 MED ORDER — AMLODIPINE BESYLATE 10 MG PO TABS
10.0000 mg | ORAL_TABLET | Freq: Every day | ORAL | Status: DC
  Administered 2014-07-25: 10 mg via ORAL
  Filled 2014-07-25: qty 1

## 2014-07-25 MED ORDER — PANTOPRAZOLE SODIUM 40 MG PO TBEC: 40.0000 mg | DELAYED_RELEASE_TABLET | Freq: Every day | ORAL | Status: DC

## 2014-07-25 MED ORDER — AMLODIPINE BESYLATE 10 MG PO TABS: 10.0000 mg | ORAL_TABLET | Freq: Every day | ORAL | Status: DC

## 2014-07-25 NOTE — Discharge Summary (Signed)
Physician Discharge Summary  Felicia Hendricks BHA:[id number] DOB: 1975/10/12 DOA: 07/24/2014  PCP: Pcp Not In System  Admit date: 07/24/2014 Discharge date: 07/25/2014  Time spent: > 35 minutes  Recommendations for Outpatient Follow-up:  1.  Monitor blood pressures and adjust antihypertensives medications 2. 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  Hospital Course:   Chest discomfort - Patient states she has a strong history of GERD. She tries to take her medications daily. I suspect GERD is most likely cause of current chest discomfort. Her troponins have been negative - No chest pain currently. I have educated patient on diet and other causes of things that can make GERD worse. Have advised to avoid such things like chocolate and tobacco use - Will discharge on Protonix and provide prescription  Hypertension - Improved control on amlodipine but will increase to 10 mg by mouth daily on discharge. - I have recommended patient discontinue nonsteroidal anti-inflammatory use  Procedures:  none Consultations:  * None*  Discharge Exam: Filed Vitals:   07/25/14 0621  BP: 173/82  Pulse: 55  Temp: 97.5 F (36.4 C)  Resp: 18    General: Pt in nad, alert and awake Cardiovascular: rrr, no mrg Respiratory: cta bl, no wheezes  Discharge Instructions   Discharge Instructions    Call MD for:  redness,  tenderness, or signs of infection (pain, swelling, redness, odor or green/yellow discharge around incision site)    Complete by:  As directed      Call MD for:  severe uncontrolled pain    Complete by:  As directed      Call MD for:  temperature >100.4    Complete by:  As directed      Diet - low sodium heart healthy    Complete by:  As directed      Discharge instructions    Complete by:  As directed   Please be sure to follow-up with her primary care physician within one or 2 weeks for further evaluation recommendations. Please avoid taking any nonsteroidal anti-inflammatories He will be discharged with new antihypertensive medication amlodipine. Please be sure to have your blood pressure rechecked within the next week and follow-up with your primary care physician for further evaluation recommendations.     Increase activity slowly    Complete by:  As directed           Current Discharge Medication List    START taking these medications   Details  amLODipine (NORVASC) 10 MG tablet Take 1 tablet (10 mg total) by mouth at bedtime. Qty: 30 tablet, Refills: 0    pantoprazole (PROTONIX) 40 MG tablet Take 1 tablet (40 mg total) by mouth daily. Qty: 30 tablet, Refills: 0      CONTINUE these medications which have NOT CHANGED   Details  METHADONE HCL PO Take 1 tablet by mouth daily. 220mg       STOP taking  these medications     ibuprofen (ADVIL,MOTRIN) 200 MG tablet      oxymetazoline (AFRIN) 0.05 % nasal spray        Allergies  Allergen Reactions  . Aspirin     Kidney disease       The results of significant diagnostics from this hospitalization (including imaging, microbiology, ancillary and laboratory) are listed below for reference.    Significant Diagnostic Studies: Dg Chest 2 View  07/24/2014   CLINICAL DATA:  Chest pain.  history of cervical cancer hepatitis-C  EXAM: CHEST  2 VIEW  COMPARISON:  None.  FINDINGS: Normal mediastinum and cardiac silhouette. Normal  pulmonary vasculature. No evidence of effusion, infiltrate, or pneumothorax. Compression deformity of the superior endplate of the L1 vertebral body with approximately 25% loss of vertebral body height.  IMPRESSION: 1.  No acute cardiopulmonary process. 2. Age-indeterminate endplate compression fracture at L1.  AA   Electronically Signed   By: Suzy Bouchard M.D.   On: 07/24/2014 11:46    Microbiology: No results found for this or any previous visit (from the past 240 hour(s)).   Labs: Basic Metabolic Panel:  Recent Labs Lab 07/24/14 1039 07/24/14 1455 07/25/14 0313  NA 139 143 139  K 5.3* 4.5 3.8  CL 104 105 104  CO2  --  29 25  GLUCOSE 131* 88 117*  BUN 16 10 17   CREATININE 1.10* 1.05* 1.00  CALCIUM  --  9.6 8.7*   Liver Function Tests: No results for input(s): AST, ALT, ALKPHOS, BILITOT, PROT, ALBUMIN in the last 168 hours. No results for input(s): LIPASE, AMYLASE in the last 168 hours. No results for input(s): AMMONIA in the last 168 hours. CBC:  Recent Labs Lab 07/24/14 1030 07/24/14 1039 07/25/14 0313  WBC 11.5*  --  9.5  NEUTROABS 6.7  --   --   HGB 14.5 15.6* 12.5  HCT 44.0 46.0 37.3  MCV 87.1  --  86.9  PLT 278  --  251   Cardiac Enzymes:  Recent Labs Lab 07/24/14 1455 07/24/14 2038 07/25/14 0200  TROPONINI <0.03 <0.03 <0.03   BNP: BNP (last 3 results) No results for input(s): BNP in the last 8760 hours.  ProBNP (last 3 results) No results for input(s): PROBNP in the last 8760 hours.  CBG: No results for input(s): GLUCAP in the last 168 hours.     Signed:  Velvet Bathe  [facility] 07/25/2014, 2:55 PM

## 2015-10-15 ENCOUNTER — Ambulatory Visit (HOSPITAL_COMMUNITY)
Admission: RE | Admit: 2015-10-15 | Discharge: 2015-10-15 | Disposition: A | Discharge status: Home / Self Care | Payer: Self-pay | Source: Ambulatory Visit | Attending: Emergency Medicine | Admitting: Emergency Medicine

## 2015-10-15 DIAGNOSIS — R001 Bradycardia, unspecified: Secondary | ICD-10-CM | POA: Insufficient documentation

## 2015-10-15 DIAGNOSIS — R079 Chest pain, unspecified: Secondary | ICD-10-CM | POA: Insufficient documentation

## 2016-07-14 ENCOUNTER — Emergency Department (HOSPITAL_COMMUNITY)
Admission: EM | Admit: 2016-07-14 | Discharge: 2016-07-14 | Disposition: A | Discharge status: Home / Self Care | Payer: Self-pay | Attending: Emergency Medicine | Admitting: Emergency Medicine

## 2016-07-14 ENCOUNTER — Encounter (HOSPITAL_COMMUNITY): Payer: Self-pay | Admitting: *Deleted

## 2016-07-14 ENCOUNTER — Other Ambulatory Visit: Payer: Self-pay

## 2016-07-14 DIAGNOSIS — Z5321 Procedure and treatment not carried out due to patient leaving prior to being seen by health care provider: Secondary | ICD-10-CM | POA: Insufficient documentation

## 2016-07-14 DIAGNOSIS — I10 Essential (primary) hypertension: Secondary | ICD-10-CM | POA: Insufficient documentation

## 2016-07-14 LAB — CBC
HCT: 38.1 % (ref 36.0–46.0)
Hemoglobin: 12 g/dL (ref 12.0–15.0)
MCH: 28.1 pg (ref 26.0–34.0)
MCHC: 31.5 g/dL (ref 30.0–36.0)
MCV: 89.2 fL (ref 78.0–100.0)
PLATELETS: 257 10*3/uL (ref 150–400)
RBC: 4.27 MIL/uL (ref 3.87–5.11)
RDW: 15.1 % (ref 11.5–15.5)
WBC: 11.1 10*3/uL — ABNORMAL HIGH (ref 4.0–10.5)

## 2016-07-14 LAB — BASIC METABOLIC PANEL
Anion gap: 11 (ref 5–15)
BUN: 16 mg/dL (ref 6–20)
CHLORIDE: 104 mmol/L (ref 101–111)
CO2: 28 mmol/L (ref 22–32)
CREATININE: 1.04 mg/dL — AB (ref 0.44–1.00)
Calcium: 9.5 mg/dL (ref 8.9–10.3)
GFR calc non Af Amer: >60 mL/min (ref >60)
Glucose, Bld: 137 mg/dL — ABNORMAL HIGH (ref 65–99)
Potassium: 4.2 mmol/L (ref 3.5–5.1)
Sodium: 143 mmol/L (ref 135–145)

## 2016-07-14 LAB — I-STAT TROPONIN, ED: Troponin i, poc: 0 ng/mL (ref 0.00–0.08)

## 2016-07-14 LAB — I-STAT CG4 LACTIC ACID, ED: Lactic Acid, Venous: 1.13 mmol/L (ref 0.5–1.9)

## 2016-07-14 NOTE — ED Notes (Signed)
Called 3X to re-check vitals; no reponse

## 2016-07-14 NOTE — ED Triage Notes (Signed)
Pt reports not feeling well x 2 months. Reports having fatigue, weakness, excessive sleeping, occ chest pains and confusion. Reports bp running high recently.

## 2016-08-09 ENCOUNTER — Ambulatory Visit (HOSPITAL_COMMUNITY)
Admission: EM | Admit: 2016-08-09 | Discharge: 2016-08-09 | Disposition: A | Discharge status: Home / Self Care | Payer: Self-pay | Attending: Internal Medicine | Admitting: Internal Medicine

## 2016-08-09 ENCOUNTER — Ambulatory Visit (INDEPENDENT_AMBULATORY_CARE_PROVIDER_SITE_OTHER): Payer: Self-pay

## 2016-08-09 ENCOUNTER — Encounter (HOSPITAL_COMMUNITY): Payer: Self-pay | Admitting: Emergency Medicine

## 2016-08-09 DIAGNOSIS — K5903 Drug induced constipation: Secondary | ICD-10-CM

## 2016-08-09 DIAGNOSIS — R112 Nausea with vomiting, unspecified: Secondary | ICD-10-CM

## 2016-08-09 HISTORY — DX: Other psychoactive substance abuse, uncomplicated: F19.10

## 2016-08-09 LAB — POCT I-STAT, CHEM 8
BUN: 25 mg/dL — ABNORMAL HIGH (ref 6–20)
CREATININE: 1.4 mg/dL — AB (ref 0.44–1.00)
Calcium, Ion: 1.24 mmol/L (ref 1.15–1.40)
Chloride: 105 mmol/L (ref 101–111)
Glucose, Bld: 131 mg/dL — ABNORMAL HIGH (ref 65–99)
HCT: 42 % (ref 36.0–46.0)
Hemoglobin: 14.3 g/dL (ref 12.0–15.0)
Potassium: 3.7 mmol/L (ref 3.5–5.1)
Sodium: 141 mmol/L (ref 135–145)
TCO2: 27 mmol/L (ref 0–100)

## 2016-08-09 MED ORDER — ONDANSETRON 4 MG PO TBDP
4.0000 mg | ORAL_TABLET | Freq: Once | ORAL | Status: AC
  Administered 2016-08-09: 4 mg via ORAL

## 2016-08-09 MED ORDER — ONDANSETRON 4 MG PO TBDP
ORAL_TABLET | ORAL | Status: AC
  Filled 2016-08-09: qty 1

## 2016-08-09 MED ORDER — ONDANSETRON 4 MG PO TBDP: ORAL_TABLET | ORAL | 0 refills | Status: DC

## 2016-08-09 NOTE — ED Triage Notes (Signed)
The patient presented to the [facility] with a complaint of N/V x 1 week. The patient denied any abdominal pain or cramping.

## 2016-08-09 NOTE — Discharge Instructions (Signed)
SinceYou are feeling better with Zofran start drinking the MiraLAX as directed. 6 ounces with a Full of pallor. Weight 30 minutes then drink 1 more. If you have not started to have good bowel movements in 6 hours repeat treatment with 2 glasses. Use the Zofran every 6 hours as needed for nausea or vomiting. If you develop abdominal pain, persistent vomiting you may need to go to the emergency department otherwise follow-up with primary care. Stop taking Pepto-Bismol. You can start taking Pepcid to help his stomach acid.

## 2016-08-09 NOTE — ED Provider Notes (Signed)
CSN: [id number]     Arrival date & time 08/09/16  1344 History   None    Chief Complaint  Patient presents with  . Nausea   (Consider location/radiation/quality/duration/timing/severity/associated sxs/prior Treatment) 41 year old female complaining of nausea and vomiting for one week. She states she is vomiting almost every hour. Yesterday she developed diarrhea, loose stools. She has had 3 loose stools today. No blood seen. Denies abdominal pain, no fever. States she has about 1 bowel movement per week. She takes methadone daily presumably for chronic back pain. She has a history of substance abuse, polycystic kidney disease with right nephrectomy and hepatitis C. in addition to the methadone she has also been taking Pepto-Bismol regularly for the nausea.       Past Medical History:  Diagnosis Date  . Cervical cancer ([place])   . Chronic back pain   . Complication of anesthesia    EPIDERALS DO NOT WORK ON ME "  . GERD (gastroesophageal reflux disease)   . Hepatitis C   . Polycystic kidney disease   . Substance abuse    Past Surgical History:  Procedure Laterality Date  . CESAREAN SECTION    . NEPHRECTOMY     History reviewed. No pertinent family history. Social History  Substance Use Topics  . Smoking status: Current Every Day Smoker    Packs/day: 1.00    Years: 25.00    Types: Cigarettes  . Smokeless tobacco: Never Used  . Alcohol use Yes     Comment: social drinker   OB History    No data available     Review of Systems  Constitutional: Positive for activity change and appetite change. Negative for fatigue.  Respiratory: Negative.   Cardiovascular: Negative.   Gastrointestinal: Positive for diarrhea, nausea and vomiting. Negative for abdominal pain.  Genitourinary: Negative.   Skin: Negative for color change.  Neurological: Negative.   All other systems reviewed and are negative.   Allergies  Aspirin  Home Medications   Prior to Admission medications    Medication Sig Start Date End Date Taking? Authorizing Provider  METHADONE HCL PO Take 1 tablet by mouth daily. 220mg    Yes [provider]  ondansetron (ZOFRAN ODT) 4 MG disintegrating tablet 1 tablet under the tongue every 6 hours as needed for nausea and vomiting 08/09/16   Janne Napoleon, NP   Meds Ordered and Administered this Visit   Medications  ondansetron (ZOFRAN-ODT) disintegrating tablet 4 mg (4 mg Oral Given 08/09/16 1446)    BP (!) 136/102 (BP Location: Left Arm)   Pulse 67   Temp 98.5 F (36.9 C) (Oral)   Resp 20   SpO2 99%  No data found.   Physical Exam  Constitutional: She is oriented to person, place, and time. She appears well-developed.  Eyes: EOM are normal.  Neck: Neck supple.  Cardiovascular: Normal rate, regular rhythm, normal heart sounds and intact distal pulses.   Pulmonary/Chest: Effort normal and breath sounds normal. No respiratory distress. She has no wheezes. She has no rales.  Abdominal: Soft.  Bowel sounds hypoactive. Abdomen mildly obese. Percusses flat in all quadrants. Palpation firm. Minor epigastric tenderness. No other areas of tenderness. No rebound or guarding. No discernible masses.  Musculoskeletal: Normal range of motion.  Neurological: She is alert and oriented to person, place, and time.  Skin: Skin is warm and dry.  Psychiatric: She has a normal mood and affect.  Nursing note and vitals reviewed.   Urgent Care Course  Procedures (including critical care time)  Labs Review Labs Reviewed  POCT I-STAT, CHEM 8 - Abnormal; Notable for the following:       Result Value   BUN 25 (*)    Creatinine, Ser 1.40 (*)    Glucose, Bld 131 (*)    All other components within normal limits    Imaging Review Dg Abd 1 View  Result Date: 08/09/2016 CLINICAL DATA:  Epigastric pain.  Vomiting and diarrhea. EXAM: ABDOMEN - 1 VIEW COMPARISON:  None FINDINGS: The bowel gas pattern is normal. No dilated loops of large or small bowel.  No fecal impaction. Moderate stool in the transverse colon. Multiple surgical clips in the right mid abdomen. No abnormal abdominal calcifications.  Bone structures are normal. IMPRESSION: Benign-appearing abdomen. Electronically Signed   By: Lorriane Shire M.D.   On: 08/09/2016 14:44     Visual Acuity Review  Right Eye Distance:   Left Eye Distance:   Bilateral Distance:    Right Eye Near:   Left Eye Near:    Bilateral Near:         MDM   1. Nausea and vomiting, intractability of vomiting not specified, unspecified vomiting type   2. Drug-induced constipation   Patient is had no vomiting since she arrived to the urgent care. She did have some nausea which was relieved by the Zofran. SinceYou are feeling better with Zofran start drinking the MiraLAX as directed. 6 ounces with a Full of pallor. Weight 30 minutes then drink 1 more. If you have not started to have good bowel movements in 6 hours repeat treatment with 2 glasses. Use the Zofran every 6 hours as needed for nausea or vomiting. If you develop abdominal pain, persistent vomiting you may need to go to the emergency department otherwise follow-up with primary care. Stop taking Pepto-Bismol. You can start taking Pepcid to help his stomach acid. Meds ordered this encounter  Medications  . ondansetron (ZOFRAN-ODT) disintegrating tablet 4 mg  . ondansetron (ZOFRAN ODT) 4 MG disintegrating tablet    Sig: 1 tablet under the tongue every 6 hours as needed for nausea and vomiting    Dispense:  12 tablet    Refill:  0    Order Specific Question:   Supervising Provider    Answer:   Sherlene Shams [[id number]]      Janne Napoleon, NP 08/09/16 [phone]

## 2016-10-03 ENCOUNTER — Ambulatory Visit: Payer: Self-pay | Admitting: Internal Medicine

## 2016-11-10 ENCOUNTER — Encounter: Payer: Self-pay | Admitting: Internal Medicine

## 2016-11-10 ENCOUNTER — Ambulatory Visit: Payer: Self-pay | Attending: Internal Medicine | Admitting: Internal Medicine

## 2016-11-10 VITALS — BP 138/90 | HR 82 | Temp 98.4°F | Resp 16 | Wt 185.0 lb

## 2016-11-10 DIAGNOSIS — Z79899 Other long term (current) drug therapy: Secondary | ICD-10-CM | POA: Insufficient documentation

## 2016-11-10 DIAGNOSIS — G8929 Other chronic pain: Secondary | ICD-10-CM | POA: Insufficient documentation

## 2016-11-10 DIAGNOSIS — Z833 Family history of diabetes mellitus: Secondary | ICD-10-CM | POA: Insufficient documentation

## 2016-11-10 DIAGNOSIS — Z720 Tobacco use: Secondary | ICD-10-CM

## 2016-11-10 DIAGNOSIS — I10 Essential (primary) hypertension: Secondary | ICD-10-CM | POA: Insufficient documentation

## 2016-11-10 DIAGNOSIS — Z9889 Other specified postprocedural states: Secondary | ICD-10-CM | POA: Insufficient documentation

## 2016-11-10 DIAGNOSIS — F17210 Nicotine dependence, cigarettes, uncomplicated: Secondary | ICD-10-CM | POA: Insufficient documentation

## 2016-11-10 DIAGNOSIS — F1110 Opioid abuse, uncomplicated: Secondary | ICD-10-CM | POA: Insufficient documentation

## 2016-11-10 DIAGNOSIS — Z87898 Personal history of other specified conditions: Secondary | ICD-10-CM

## 2016-11-10 DIAGNOSIS — Z886 Allergy status to analgesic agent status: Secondary | ICD-10-CM | POA: Insufficient documentation

## 2016-11-10 DIAGNOSIS — H5203 Hypermetropia, bilateral: Secondary | ICD-10-CM

## 2016-11-10 DIAGNOSIS — G4700 Insomnia, unspecified: Secondary | ICD-10-CM

## 2016-11-10 DIAGNOSIS — B182 Chronic viral hepatitis C: Secondary | ICD-10-CM

## 2016-11-10 DIAGNOSIS — K5900 Constipation, unspecified: Secondary | ICD-10-CM

## 2016-11-10 DIAGNOSIS — Q613 Polycystic kidney, unspecified: Secondary | ICD-10-CM

## 2016-11-10 DIAGNOSIS — F1111 Opioid abuse, in remission: Secondary | ICD-10-CM

## 2016-11-10 DIAGNOSIS — Z905 Acquired absence of kidney: Secondary | ICD-10-CM | POA: Insufficient documentation

## 2016-11-10 DIAGNOSIS — Z8249 Family history of ischemic heart disease and other diseases of the circulatory system: Secondary | ICD-10-CM | POA: Insufficient documentation

## 2016-11-10 DIAGNOSIS — K219 Gastro-esophageal reflux disease without esophagitis: Secondary | ICD-10-CM | POA: Insufficient documentation

## 2016-11-10 MED ORDER — ONDANSETRON 4 MG PO TBDP: 4.0000 mg | ORAL_TABLET | Freq: Two times a day (BID) | ORAL | 0 refills | Status: DC | PRN

## 2016-11-10 MED ORDER — ONDANSETRON 4 MG PO TBDP: ORAL_TABLET | ORAL | 0 refills | Status: DC

## 2016-11-10 NOTE — Patient Instructions (Signed)

## 2016-11-10 NOTE — Progress Notes (Signed)
Patient ID: Felicia Hendricks, female    DOB: 03/16/1975  MRN: [id number]  CC: New Patient (Initial Visit)   Subjective: Felicia Hendricks is a 41 y.o. female who presents for new patient visit. Daughter is with her. Her concerns today include:  New pt.  Wanting a PCP. 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 Patient has several concerns today. 1. Complains of constipation and intermittent nausea since being on methadone. Seen at urgent care for the same MiraLAX recommended. This works well for her. Purchase over-the-counter. -Given Zofran which helped with the nausea. Request refill.  2. Gives history of hypertension. Hospitalized 2 years ago with chest pains and was started on Norvasc at that time for blood pressure. No refills given. -Reports blood pressure check at methadone clinic has been high sometimes SBP in the 200s. -No chest pains or shortness of breath. No lower extremity edema -Does complain of blurred vision when reading and occasional headaches  3. Tobacco abuse: Smokes 1 pack a day since age of 53. Never tried to quit and does not want to.  4. Complains of daytime tiredness.  -Daughter says she snores a little -Not sleeping well at nights. Goes to bed around 9-10 PM. -Has problems staying asleep -Drinks a lot of iced tea and keeps a glass by her bed which he drinks constantly through the night  Has applied for disability. Was denied. Trying again. Patient Active Problem List   Diagnosis Date Noted  . Precordial chest pain 07/24/2014  . Tobacco abuse 07/24/2014  . Uncontrolled hypertension 07/24/2014  . GERD (gastroesophageal reflux disease) 07/24/2014  . Essential hypertension   . Chronic back pain      Current Outpatient Prescriptions on File Prior to Visit  Medication Sig Dispense Refill  .  METHADONE HCL PO Take 1 tablet by mouth daily. 220mg      No current facility-administered medications on file prior to visit.     Allergies  Allergen Reactions  . Aspirin     Kidney disease     Social History   Social History  . Marital status: Widowed    Spouse name: N/A  . Number of children: N/A  . Years of education: N/A   Occupational History  . Not on file.   Social History Main Topics  . Smoking status: Current Every Day Smoker    Packs/day: 1.00    Years: 25.00    Types: Cigarettes  . Smokeless tobacco: Never Used  . Alcohol use Yes     Comment: social drinker  . Drug use: No     Comment: history of drug abuse  . Sexual activity: Not on file   Other Topics Concern  . Not on file   Social History Narrative  . No narrative on file    Family History  Problem Relation Age of Onset  . Heart disease Mother   . Diabetes Father     Past Surgical History:  Procedure Laterality Date  . CESAREAN SECTION    . NEPHRECTOMY      ROS: Review of Systems  Constitutional: Positive for fatigue.  HENT: Negative for congestion.   Respiratory: Negative for chest tightness and shortness of breath.   Cardiovascular: Negative for chest pain, palpitations and leg swelling.  Gastrointestinal: Positive for constipation. Negative for abdominal pain and diarrhea.  Neurological: Positive for  headaches. Negative for dizziness.  Psychiatric/Behavioral: Positive for sleep disturbance.    PHYSICAL EXAM: BP 138/90   Pulse 82   Temp 98.4 F (36.9 C) (Oral)   Resp 16   Wt 185 lb (83.9 kg)   SpO2 98%   BMI 31.76 kg/m   BP120/80 RT, 115/80 LT  Physical Exam General appearance - alert, well appearing, middle-age Caucasian female and in no distress Mental status - alert, oriented to person, place, and time, normal mood, behavior, speech, dress, motor activity, and thought processes Eyes - pupils equal and reactive, extraocular eye movements intact Mouth - mucous membranes  moist, pharynx normal without lesions Neck - supple, no significant adenopathy Chest - clear to auscultation, no wheezes, rales or rhonchi, symmetric air entry Heart - normal rate, regular rhythm, normal S1, S2, no murmurs, rubs, clicks or gallops Extremities - peripheral pulses normal, no pedal edema, no clubbing or cyanosis  Depression screen PHQ 2/9 11/10/2016  Decreased Interest 0  Down, Depressed, Hopeless 0  PHQ - 2 Score 0    ASSESSMENT AND PLAN: 1. Polycystic kidney - Comprehensive metabolic panel - CBC - Lipid panel  2. Tobacco abuse Patient advised to quit smoking. Discussed health risks associated with smoking including lung and other types of cancers, chronic lung diseases and CV risks.. Pt not ready to give trail of quitting.  Discussed methods to help quit including quitting cold [place], use of NRT, Chantix and Bupropion.   3. Constipation, unspecified constipation type -Continue MiraLAX over-the-counter. - ondansetron (ZOFRAN ODT) 4 MG disintegrating tablet; Take 1 tablet (4 mg total) by mouth 2 (two) times daily as needed for nausea or vomiting.  Dispense: 20 tablet; Refill: 0  4. Hep C w/o coma, chronic ([place]) -Patient to apply for [facility] card/cone discount. Once approved we can refer her to ID for treatment - HIV antibody  5. Hx of opioid abuse  -In remission since 2013. On methadone  6. Hypermetropia of both eyes -Patient advised to have eye exam at [facility] or [facility]  7. Insomnia, unspecified type Good sleep hygiene discussed. Advised that she stop drinking caffeinated beverages at 5 PM.  8. In regards to blood pressure, repeat blood pressure today looks pretty good. We will hold off on putting her on blood pressure medications. -DASH diet discussed. -recheck BP on f/u visit in 1 mth  Patient was given the opportunity to ask questions.  Patient verbalized understanding of the plan and was able to repeat key elements of the plan.   Orders Placed This  Encounter  Procedures  . Comprehensive metabolic panel  . CBC  . HIV antibody  . Lipid panel     Requested Prescriptions   Signed Prescriptions Disp Refills  . ondansetron (ZOFRAN ODT) 4 MG disintegrating tablet 20 tablet 0    Sig: Take 1 tablet (4 mg total) by mouth 2 (two) times daily as needed for nausea or vomiting.    Return in about 1 month (around 12/10/2016) for pap.  Karle Plumber, MD, FACP

## 2016-11-11 ENCOUNTER — Telehealth: Payer: Self-pay | Admitting: Internal Medicine

## 2016-11-11 LAB — COMPREHENSIVE METABOLIC PANEL
A/G RATIO: 2 (ref 1.2–2.2)
ALT: 18 IU/L (ref 0–32)
AST: 21 IU/L (ref 0–40)
Albumin: 4.8 g/dL (ref 3.5–5.5)
Alkaline Phosphatase: 115 IU/L (ref 39–117)
BUN/Creatinine Ratio: 12 (ref 9–23)
BUN: 13 mg/dL (ref 6–24)
Bilirubin Total: <0.2 mg/dL (ref 0.0–1.2)
CALCIUM: 9.6 mg/dL (ref 8.7–10.2)
CO2: 21 mmol/L (ref 20–29)
Chloride: 106 mmol/L (ref 96–106)
Creatinine, Ser: 1.12 mg/dL — ABNORMAL HIGH (ref 0.57–1.00)
GFR calc Af Amer: 70 mL/min/{1.73_m2} (ref >59)
GFR, EST NON AFRICAN AMERICAN: 60 mL/min/{1.73_m2} (ref >59)
GLUCOSE: 83 mg/dL (ref 65–99)
Globulin, Total: 2.4 g/dL (ref 1.5–4.5)
Potassium: 3.9 mmol/L (ref 3.5–5.2)
Sodium: 148 mmol/L — ABNORMAL HIGH (ref 134–144)
TOTAL PROTEIN: 7.2 g/dL (ref 6.0–8.5)

## 2016-11-11 LAB — CBC
Hematocrit: 36.3 % (ref 34.0–46.6)
Hemoglobin: 11.8 g/dL (ref 11.1–15.9)
MCH: 28 pg (ref 26.6–33.0)
MCHC: 32.5 g/dL (ref 31.5–35.7)
MCV: 86 fL (ref 79–97)
Platelets: 339 10*3/uL (ref 150–379)
RBC: 4.21 x10E6/uL (ref 3.77–5.28)
RDW: 15.6 % — AB (ref 12.3–15.4)
WBC: 14 10*3/uL — ABNORMAL HIGH (ref 3.4–10.8)

## 2016-11-11 LAB — HIV ANTIBODY (ROUTINE TESTING W REFLEX): HIV Screen 4th Generation wRfx: NONREACTIVE

## 2016-11-11 LAB — LIPID PANEL
CHOL/HDL RATIO: 7.3 ratio — AB (ref 0.0–4.4)
Cholesterol, Total: 241 mg/dL — ABNORMAL HIGH (ref 100–199)
HDL: 33 mg/dL — ABNORMAL LOW (ref >39)
LDL Calculated: 152 mg/dL — ABNORMAL HIGH (ref 0–99)
Triglycerides: 278 mg/dL — ABNORMAL HIGH (ref 0–149)
VLDL Cholesterol Cal: 56 mg/dL — ABNORMAL HIGH (ref 5–40)

## 2016-11-11 NOTE — Telephone Encounter (Signed)
PC placed to pt today to discuss lab results. I got automated voicemail. I did not leave a message.  Lab result note sent to my CMA to contact pt next wk.

## 2016-11-14 ENCOUNTER — Telehealth: Payer: Self-pay

## 2016-11-14 MED FILL — ONDANSETRON ODT 4 MG TABLET: 4 | 10 days supply | Qty: 20 | Fill #0

## 2016-11-14 NOTE — Telephone Encounter (Signed)
Contacted pt to go over lab results pt is aware of results and rx sent to pharmacy. Pt doesn't have any questions or concerns

## 2016-12-08 ENCOUNTER — Ambulatory Visit: Payer: Self-pay | Attending: Internal Medicine | Admitting: Internal Medicine

## 2016-12-08 ENCOUNTER — Encounter: Payer: Self-pay | Admitting: Internal Medicine

## 2016-12-08 VITALS — BP 150/99 | HR 62 | Temp 98.6°F | Resp 16 | Wt 185.6 lb

## 2016-12-08 DIAGNOSIS — M549 Dorsalgia, unspecified: Secondary | ICD-10-CM | POA: Insufficient documentation

## 2016-12-08 DIAGNOSIS — E785 Hyperlipidemia, unspecified: Secondary | ICD-10-CM | POA: Insufficient documentation

## 2016-12-08 DIAGNOSIS — K219 Gastro-esophageal reflux disease without esophagitis: Secondary | ICD-10-CM | POA: Insufficient documentation

## 2016-12-08 DIAGNOSIS — Z1239 Encounter for other screening for malignant neoplasm of breast: Secondary | ICD-10-CM

## 2016-12-08 DIAGNOSIS — I10 Essential (primary) hypertension: Secondary | ICD-10-CM | POA: Insufficient documentation

## 2016-12-08 DIAGNOSIS — Z23 Encounter for immunization: Secondary | ICD-10-CM | POA: Insufficient documentation

## 2016-12-08 DIAGNOSIS — Z1231 Encounter for screening mammogram for malignant neoplasm of breast: Secondary | ICD-10-CM

## 2016-12-08 DIAGNOSIS — G8929 Other chronic pain: Secondary | ICD-10-CM | POA: Insufficient documentation

## 2016-12-08 DIAGNOSIS — D72829 Elevated white blood cell count, unspecified: Secondary | ICD-10-CM | POA: Insufficient documentation

## 2016-12-08 DIAGNOSIS — Z01419 Encounter for gynecological examination (general) (routine) without abnormal findings: Secondary | ICD-10-CM | POA: Insufficient documentation

## 2016-12-08 DIAGNOSIS — Z124 Encounter for screening for malignant neoplasm of cervix: Secondary | ICD-10-CM

## 2016-12-08 DIAGNOSIS — F17210 Nicotine dependence, cigarettes, uncomplicated: Secondary | ICD-10-CM | POA: Insufficient documentation

## 2016-12-08 DIAGNOSIS — F1111 Opioid abuse, in remission: Secondary | ICD-10-CM | POA: Insufficient documentation

## 2016-12-08 DIAGNOSIS — B182 Chronic viral hepatitis C: Secondary | ICD-10-CM | POA: Insufficient documentation

## 2016-12-08 MED ORDER — ATORVASTATIN CALCIUM 10 MG PO TABS: 10.0000 mg | ORAL_TABLET | Freq: Every day | ORAL | 3 refills | Status: DC

## 2016-12-08 MED ORDER — AMLODIPINE BESYLATE 5 MG PO TABS: 5.0000 mg | ORAL_TABLET | Freq: Every day | ORAL | 3 refills | Status: DC

## 2016-12-08 MED ORDER — ONDANSETRON 4 MG PO TBDP: 4.0000 mg | ORAL_TABLET | Freq: Two times a day (BID) | ORAL | 0 refills | Status: DC | PRN

## 2016-12-08 MED FILL — ONDANSETRON ODT 4 MG TABLET: 4 | 10 days supply | Qty: 20 | Fill #0

## 2016-12-08 MED FILL — AMLODIPINE BESYLATE 5 MG TA: 5 | 30 days supply | Qty: 30 | Fill #0

## 2016-12-08 MED FILL — ?ATORVASTATIN 10 MG TABLET: 10 | 30 days supply | Qty: 30 | Fill #0

## 2016-12-08 NOTE — Progress Notes (Signed)
Patient ID: Felicia Hendricks, female    DOB: 1975/10/06  MRN: [id number]  CC: Gynecologic Exam   Subjective: Felicia Hendricks is a 41 y.o. female who presents for one-month follow-up for Pap smear Her concerns today include:   1. PAP: G3P2 Abnormal pap in 1991 and 1995. Had LEEP. No abnormal Paps since that -no vaginal discgh or itching at this time. Not sexually active in yrs. Does not want STI screening  2. BP: Checking since last visit. SBP has been in the 160s. -not limiting salt in foods  3. Request RF on med for nausea.   4. Went over labs from last visit. -Lipids were elevated. I recommended starting Lipitor but she did not get the prescription as yet. White blood cells were elevated Patient Active Problem List   Diagnosis Date Noted  . Hep C w/o coma, chronic ([place]) 11/10/2016  . Hx of opioid abuse 11/10/2016  . Insomnia 11/10/2016  . Polycystic kidney 11/10/2016  . Tobacco abuse 07/24/2014  . GERD (gastroesophageal reflux disease) 07/24/2014  . Essential hypertension   . Chronic back pain      Current Outpatient Prescriptions on File Prior to Visit  Medication Sig Dispense Refill  . METHADONE HCL PO Take 1 tablet by mouth daily. 220mg      No current facility-administered medications on file prior to visit.     Allergies  Allergen Reactions  . Aspirin     Kidney disease     Social History   Social History  . Marital status: Widowed    Spouse name: N/A  . Number of children: N/A  . Years of education: N/A   Occupational History  . Not on file.   Social History Main Topics  . Smoking status: Current Every Day Smoker    Packs/day: 1.00    Years: 25.00    Types: Cigarettes  . Smokeless tobacco: Never Used  . Alcohol use Yes     Comment: social drinker  . Drug use: No     Comment: history of drug abuse  . Sexual activity: Not on file   Other Topics Concern  . Not on file   Social History Narrative  . No narrative on file    Family  History  Problem Relation Age of Onset  . Heart disease Mother   . Diabetes Father     Past Surgical History:  Procedure Laterality Date  . CESAREAN SECTION    . NEPHRECTOMY      ROS: Review of Systems negative except as stated above PHYSICAL EXAM: BP (!) 150/99   Pulse 62   Temp 98.6 F (37 C) (Oral)   Resp 16   Wt 185 lb 9.6 oz (84.2 kg)   SpO2 94%   BMI 31.86 kg/m   Physical Exam  General appearance - alert, well appearing, and in no distress Mental status - alert, oriented to person, place, and time, normal mood, behavior, speech, dress, motor activity, and thought processes Breasts - breasts appear normal, no suspicious masses, no skin or nipple changes or axillary nodes Pelvic - NP student Jenny Reichmann present: normal external genitalia, vulva, vagina, cervix, uterus and adnexa. Small amount of white discharge around the cervix. No cervical motion tenderness  Results for orders placed or performed in visit on 11/10/16  Comprehensive metabolic panel  Result Value Ref Range   Glucose 83 65 - 99 mg/dL   BUN 13 6 - 24 mg/dL   Creatinine, Ser 1.12 (H) 0.57 - 1.00 mg/dL  GFR calc non Af Amer 60 >59 mL/min/1.73   GFR calc Af Amer 70 >59 mL/min/1.73   BUN/Creatinine Ratio 12 9 - 23   Sodium 148 (H) 134 - 144 mmol/L   Potassium 3.9 3.5 - 5.2 mmol/L   Chloride 106 96 - 106 mmol/L   CO2 21 20 - 29 mmol/L   Calcium 9.6 8.7 - 10.2 mg/dL   Total Protein 7.2 6.0 - 8.5 g/dL   Albumin 4.8 3.5 - 5.5 g/dL   Globulin, Total 2.4 1.5 - 4.5 g/dL   Albumin/Globulin Ratio 2.0 1.2 - 2.2   Bilirubin Total <0.2 0.0 - 1.2 mg/dL   Alkaline Phosphatase 115 39 - 117 IU/L   AST 21 0 - 40 IU/L   ALT 18 0 - 32 IU/L  CBC  Result Value Ref Range   WBC 14.0 (H) 3.4 - 10.8 x10E3/uL   RBC 4.21 3.77 - 5.28 x10E6/uL   Hemoglobin 11.8 11.1 - 15.9 g/dL   Hematocrit 36.3 34.0 - 46.6 %   MCV 86 79 - 97 fL   MCH 28.0 26.6 - 33.0 pg   MCHC 32.5 31.5 - 35.7 g/dL   RDW 15.6 (H) 12.3 - 15.4 %   Platelets  339 150 - 379 x10E3/uL  HIV antibody  Result Value Ref Range   HIV Screen 4th Generation wRfx Non Reactive Non Reactive  Lipid panel  Result Value Ref Range   Cholesterol, Total 241 (H) 100 - 199 mg/dL   Triglycerides 278 (H) 0 - 149 mg/dL   HDL 33 (L) >39 mg/dL   VLDL Cholesterol Cal 56 (H) 5 - 40 mg/dL   LDL Calculated 152 (H) 0 - 99 mg/dL   Chol/HDL Ratio 7.3 (H) 0.0 - 4.4 ratio    ASSESSMENT AND PLAN: 1. Pap smear for cervical cancer screening - Cytology - PAP  2. Hyperlipidemia, unspecified hyperlipidemia type - atorvastatin (LIPITOR) 10 MG tablet; Take 1 tablet (10 mg total) by mouth daily.  Dispense: 90 tablet; Refill: 3  3. Essential hypertension -Ambulatory blood pressures elevated blood pressure today elevated. Start amlodipine DASH diet discussed and encouraged - amLODipine (NORVASC) 5 MG tablet; Take 1 tablet (5 mg total) by mouth daily.  Dispense: 90 tablet; Refill: 3  4. Need for influenza vaccination   5. Breast cancer screening - MM Digital Screening; Future  6. Leukocytosis, unspecified type - CBC With Differential   Patient was given the opportunity to ask questions.  Patient verbalized understanding of the plan and was able to repeat key elements of the plan.   Orders Placed This Encounter  Procedures  . MM Digital Screening  . CBC With Differential     Requested Prescriptions   Signed Prescriptions Disp Refills  . amLODipine (NORVASC) 5 MG tablet 90 tablet 3    Sig: Take 1 tablet (5 mg total) by mouth daily.  . ondansetron (ZOFRAN ODT) 4 MG disintegrating tablet 20 tablet 0    Sig: Take 1 tablet (4 mg total) by mouth 2 (two) times daily as needed for nausea or vomiting.  Marland Kitchen atorvastatin (LIPITOR) 10 MG tablet 90 tablet 3    Sig: Take 1 tablet (10 mg total) by mouth daily.    Return in about 4 months (around 04/09/2017).  Karle Plumber, MD, FACP

## 2016-12-08 NOTE — Patient Instructions (Addendum)
Try to limit salt in your food as discussed.  Start Amlodipine to help lower blood pressure.   Start Lipitor for cholesterol.

## 2016-12-09 LAB — CBC WITH DIFFERENTIAL
BASOS: 0 %
Basophils Absolute: 0 10*3/uL (ref 0.0–0.2)
EOS (ABSOLUTE): 0.2 10*3/uL (ref 0.0–0.4)
EOS: 2 %
HEMATOCRIT: 39 % (ref 34.0–46.6)
HEMOGLOBIN: 12.6 g/dL (ref 11.1–15.9)
IMMATURE GRANULOCYTES: 0 %
Immature Grans (Abs): 0 10*3/uL (ref 0.0–0.1)
LYMPHS ABS: 5.4 10*3/uL — AB (ref 0.7–3.1)
Lymphs: 47 %
MCH: 27.9 pg (ref 26.6–33.0)
MCHC: 32.3 g/dL (ref 31.5–35.7)
MCV: 87 fL (ref 79–97)
MONOCYTES: 5 %
MONOS ABS: 0.5 10*3/uL (ref 0.1–0.9)
NEUTROS ABS: 5.3 10*3/uL (ref 1.4–7.0)
Neutrophils: 46 %
RBC: 4.51 x10E6/uL (ref 3.77–5.28)
RDW: 15.4 % (ref 12.3–15.4)
WBC: 11.6 10*3/uL — ABNORMAL HIGH (ref 3.4–10.8)

## 2016-12-12 LAB — CYTOLOGY - PAP
Diagnosis: NEGATIVE
HPV (WINDOPATH): NOT DETECTED

## 2016-12-14 ENCOUNTER — Telehealth: Payer: Self-pay

## 2016-12-14 NOTE — Telephone Encounter (Signed)
Contacted pt to go over pap results pt is aware and doesn't have any questions or concerns  

## 2016-12-26 ENCOUNTER — Other Ambulatory Visit (HOSPITAL_COMMUNITY): Payer: Self-pay | Admitting: Nurse Practitioner

## 2016-12-26 DIAGNOSIS — Q249 Congenital malformation of heart, unspecified: Secondary | ICD-10-CM

## 2016-12-27 ENCOUNTER — Ambulatory Visit (HOSPITAL_COMMUNITY)
Admission: RE | Admit: 2016-12-27 | Discharge: 2016-12-27 | Disposition: A | Discharge status: Home / Self Care | Payer: Self-pay | Source: Ambulatory Visit | Attending: Nurse Practitioner | Admitting: Nurse Practitioner

## 2016-12-27 DIAGNOSIS — Z029 Encounter for administrative examinations, unspecified: Secondary | ICD-10-CM | POA: Insufficient documentation

## 2016-12-28 ENCOUNTER — Other Ambulatory Visit: Payer: Self-pay | Admitting: Internal Medicine

## 2016-12-28 DIAGNOSIS — Z1231 Encounter for screening mammogram for malignant neoplasm of breast: Secondary | ICD-10-CM

## 2017-01-09 ENCOUNTER — Other Ambulatory Visit: Payer: Self-pay | Admitting: Family Medicine

## 2017-02-12 MED FILL — AMLODIPINE BESYLATE 5 MG TA: 5 | 30 days supply | Qty: 30 | Fill #1

## 2017-02-12 MED FILL — ?ATORVASTATIN 10 MG TABLET: 10 | 30 days supply | Qty: 30 | Fill #1

## 2017-02-13 ENCOUNTER — Encounter: Payer: Self-pay | Admitting: Internal Medicine

## 2017-02-13 ENCOUNTER — Ambulatory Visit: Payer: Self-pay | Attending: Internal Medicine | Admitting: Internal Medicine

## 2017-02-13 ENCOUNTER — Other Ambulatory Visit: Payer: Self-pay | Admitting: Internal Medicine

## 2017-02-13 VITALS — BP 135/87 | HR 76 | Temp 98.3°F | Resp 18 | Ht 65.0 in | Wt 184.0 lb

## 2017-02-13 DIAGNOSIS — Q613 Polycystic kidney, unspecified: Secondary | ICD-10-CM | POA: Insufficient documentation

## 2017-02-13 DIAGNOSIS — Z79899 Other long term (current) drug therapy: Secondary | ICD-10-CM | POA: Insufficient documentation

## 2017-02-13 DIAGNOSIS — Z9889 Other specified postprocedural states: Secondary | ICD-10-CM | POA: Insufficient documentation

## 2017-02-13 DIAGNOSIS — F17210 Nicotine dependence, cigarettes, uncomplicated: Secondary | ICD-10-CM | POA: Insufficient documentation

## 2017-02-13 DIAGNOSIS — Z833 Family history of diabetes mellitus: Secondary | ICD-10-CM | POA: Insufficient documentation

## 2017-02-13 DIAGNOSIS — K529 Noninfective gastroenteritis and colitis, unspecified: Secondary | ICD-10-CM | POA: Insufficient documentation

## 2017-02-13 DIAGNOSIS — F1110 Opioid abuse, uncomplicated: Secondary | ICD-10-CM | POA: Insufficient documentation

## 2017-02-13 DIAGNOSIS — I10 Essential (primary) hypertension: Secondary | ICD-10-CM | POA: Insufficient documentation

## 2017-02-13 DIAGNOSIS — Z905 Acquired absence of kidney: Secondary | ICD-10-CM | POA: Insufficient documentation

## 2017-02-13 DIAGNOSIS — B182 Chronic viral hepatitis C: Secondary | ICD-10-CM | POA: Insufficient documentation

## 2017-02-13 DIAGNOSIS — E785 Hyperlipidemia, unspecified: Secondary | ICD-10-CM | POA: Insufficient documentation

## 2017-02-13 DIAGNOSIS — Z886 Allergy status to analgesic agent status: Secondary | ICD-10-CM | POA: Insufficient documentation

## 2017-02-13 DIAGNOSIS — Z8249 Family history of ischemic heart disease and other diseases of the circulatory system: Secondary | ICD-10-CM | POA: Insufficient documentation

## 2017-02-13 DIAGNOSIS — K219 Gastro-esophageal reflux disease without esophagitis: Secondary | ICD-10-CM | POA: Insufficient documentation

## 2017-02-13 DIAGNOSIS — Z8541 Personal history of malignant neoplasm of cervix uteri: Secondary | ICD-10-CM | POA: Insufficient documentation

## 2017-02-13 MED ORDER — ONDANSETRON 4 MG PO TBDP: 4.0000 mg | ORAL_TABLET | Freq: Two times a day (BID) | ORAL | 0 refills | Status: DC | PRN

## 2017-02-13 MED FILL — ONDANSETRON ODT 4 MG TABLET: 4 | 7 days supply | Qty: 15 | Fill #0

## 2017-02-13 NOTE — Patient Instructions (Signed)

## 2017-02-13 NOTE — Progress Notes (Signed)
Patient ID: Felicia Hendricks, female    DOB: 1975-04-25  MRN: [id number]  CC: Emesis   Subjective: Felicia Hendricks is a 41 y.o. female who presents for UC Her concerns today include:  HTN, HL,PKD, hepatitis C never treated, tob abuse, cervical CA treated with LEEP procedure per patient, tobacco abuse, substance abuse currently on methadone clean since 2013. .  1. Pt c/o N/V x 3 days. Requested RF on Zofran but was told she needed to be seen -some diarrhea today of 3 loose stools. No blood in stools -no fever No abdominal cramps -no recent abx use. Lives with an aunt who has been sick recently with abdominal cramps -tried to eat noodles this a.m but it did not stay down.  -used some Imodium today.   2. She brings a copy of EKG that the Methadone clinic had her do at [place] 01/2017. Shows QT of 430, improved from over 500 in May 2018.  Patient Active Problem List   Diagnosis Date Noted  . Leukocytosis 12/08/2016  . Hyperlipidemia 12/08/2016  . Hep C w/o coma, chronic ([place]) 11/10/2016  . Hx of opioid abuse 11/10/2016  . Insomnia 11/10/2016  . Polycystic kidney 11/10/2016  . Tobacco abuse 07/24/2014  . GERD (gastroesophageal reflux disease) 07/24/2014  . Essential hypertension   . Chronic back pain      Current Outpatient Medications on File Prior to Visit  Medication Sig Dispense Refill  . amLODipine (NORVASC) 5 MG tablet Take 1 tablet (5 mg total) by mouth daily. 90 tablet 3  . atorvastatin (LIPITOR) 10 MG tablet Take 1 tablet (10 mg total) by mouth daily. 90 tablet 3  . METHADONE HCL PO Take 1 tablet by mouth daily. 220mg     . ondansetron (ZOFRAN ODT) 4 MG disintegrating tablet Take 1 tablet (4 mg total) by mouth 2 (two) times daily as needed for nausea or vomiting. 20 tablet 0   No current facility-administered medications on file prior to visit.     Allergies  Allergen Reactions  . Aspirin     Kidney disease     Social History   Socioeconomic History   . Marital status: Widowed    Spouse name: Not on file  . Number of children: Not on file  . Years of education: Not on file  . Highest education level: Not on file  Social Needs  . Financial resource strain: Not on file  . Food insecurity - worry: Not on file  . Food insecurity - inability: Not on file  . Transportation needs - medical: Not on file  . Transportation needs - non-medical: Not on file  Occupational History  . Not on file  Tobacco Use  . Smoking status: Current Every Day Smoker    Packs/day: 1.00    Years: 25.00    Pack years: 25.00    Types: Cigarettes  . Smokeless tobacco: Never Used  Substance and Sexual Activity  . Alcohol use: Yes    Comment: social drinker  . Drug use: No    Comment: history of drug abuse  . Sexual activity: Not on file  Other Topics Concern  . Not on file  Social History Narrative  . Not on file    Family History  Problem Relation Age of Onset  . Heart disease Mother   . Diabetes Father     Past Surgical History:  Procedure Laterality Date  . CESAREAN SECTION    . NEPHRECTOMY  ROS: Review of Systems Neg except as above PHYSICAL EXAM: BP 135/87 (BP Location: Left Arm, Patient Position: Sitting, Cuff Size: Large)   Pulse 76   Temp 98.3 F (36.8 C) (Oral)   Resp 18   Ht 5\' 5"  (1.651 m)   Wt 184 lb (83.5 kg)   SpO2 98%   BMI 30.62 kg/m   Physical Exam General appearance - alert, well appearing, middle age female and in no distress Mental status - alert, oriented to person, place, and time, normal mood, behavior, speech, dress, motor activity, and thought processes Mouth - mucous membranes moist, pharynx normal without lesions Abdomen - soft, nontender, nondistended, no masses or organomegaly  ASSESSMENT AND PLAN: 1. Acute gastroenteritis -push oral fluids -Zofran and Imodium PRN - ondansetron (ZOFRAN ODT) 4 MG disintegrating tablet; Take 1 tablet (4 mg total) by mouth 2 (two) times daily as needed for nausea or  vomiting.  Dispense: 15 tablet; Refill: 0  Patient was given the opportunity to ask questions.  Patient verbalized understanding of the plan and was able to repeat key elements of the plan.   No orders of the defined types were placed in this encounter.    Requested Prescriptions    No prescriptions requested or ordered in this encounter    F/u PRN Karle Plumber, MD, Rosalita Chessman

## 2017-02-15 ENCOUNTER — Ambulatory Visit: Payer: Self-pay | Admitting: Internal Medicine

## 2017-02-19 ENCOUNTER — Ambulatory Visit: Payer: Self-pay | Admitting: Internal Medicine

## 2017-04-09 ENCOUNTER — Ambulatory Visit: Payer: Self-pay | Admitting: Internal Medicine

## 2017-06-11 ENCOUNTER — Encounter: Payer: Self-pay | Admitting: Internal Medicine

## 2017-06-11 ENCOUNTER — Ambulatory Visit: Payer: Self-pay | Attending: Internal Medicine | Admitting: Internal Medicine

## 2017-06-11 VITALS — BP 156/99 | HR 70 | Temp 98.8°F | Resp 16 | Wt 195.6 lb

## 2017-06-11 DIAGNOSIS — E785 Hyperlipidemia, unspecified: Secondary | ICD-10-CM | POA: Insufficient documentation

## 2017-06-11 DIAGNOSIS — Z8249 Family history of ischemic heart disease and other diseases of the circulatory system: Secondary | ICD-10-CM | POA: Insufficient documentation

## 2017-06-11 DIAGNOSIS — Z886 Allergy status to analgesic agent status: Secondary | ICD-10-CM | POA: Insufficient documentation

## 2017-06-11 DIAGNOSIS — K219 Gastro-esophageal reflux disease without esophagitis: Secondary | ICD-10-CM | POA: Insufficient documentation

## 2017-06-11 DIAGNOSIS — B182 Chronic viral hepatitis C: Secondary | ICD-10-CM | POA: Insufficient documentation

## 2017-06-11 DIAGNOSIS — F17210 Nicotine dependence, cigarettes, uncomplicated: Secondary | ICD-10-CM | POA: Insufficient documentation

## 2017-06-11 DIAGNOSIS — Z905 Acquired absence of kidney: Secondary | ICD-10-CM | POA: Insufficient documentation

## 2017-06-11 DIAGNOSIS — Z79899 Other long term (current) drug therapy: Secondary | ICD-10-CM | POA: Insufficient documentation

## 2017-06-11 DIAGNOSIS — G8929 Other chronic pain: Secondary | ICD-10-CM | POA: Insufficient documentation

## 2017-06-11 DIAGNOSIS — Z8541 Personal history of malignant neoplasm of cervix uteri: Secondary | ICD-10-CM | POA: Insufficient documentation

## 2017-06-11 DIAGNOSIS — Z833 Family history of diabetes mellitus: Secondary | ICD-10-CM | POA: Insufficient documentation

## 2017-06-11 DIAGNOSIS — M549 Dorsalgia, unspecified: Secondary | ICD-10-CM | POA: Insufficient documentation

## 2017-06-11 DIAGNOSIS — K029 Dental caries, unspecified: Secondary | ICD-10-CM | POA: Insufficient documentation

## 2017-06-11 DIAGNOSIS — I10 Essential (primary) hypertension: Secondary | ICD-10-CM | POA: Insufficient documentation

## 2017-06-11 MED ORDER — PENICILLIN V POTASSIUM 250 MG PO TABS: 250.0000 mg | ORAL_TABLET | Freq: Four times a day (QID) | ORAL | 0 refills | Status: DC

## 2017-06-11 MED ORDER — AMLODIPINE BESYLATE 10 MG PO TABS: 10.0000 mg | ORAL_TABLET | Freq: Every day | ORAL | 6 refills | Status: DC

## 2017-06-11 MED ORDER — PENICILLIN V POTASSIUM 500 MG PO TABS: 500.0000 mg | ORAL_TABLET | Freq: Three times a day (TID) | ORAL | 0 refills | Status: DC

## 2017-06-11 MED FILL — PENICILLIN VK 500 MG TABLET: 500 | 7 days supply | Qty: 21 | Fill #0

## 2017-06-11 MED FILL — AMLODIPINE BESYLATE 10 MG T: 10 | 30 days supply | Qty: 30 | Fill #0

## 2017-06-11 NOTE — Progress Notes (Signed)
Pt states she has been taking 4 aleve for th pain and it has been helping

## 2017-06-11 NOTE — Patient Instructions (Addendum)
Take penicillin as prescribed.  Make an appointment with a dentist as soon as possible.  Increase amlodipine to 10 mg daily.  Try to limit salt in the foods.  Blood pressure goal is 130/80 or lower.

## 2017-06-11 NOTE — Progress Notes (Signed)
Patient ID: Briane Birden, female    DOB: 1975-10-25  MRN: [id number]  CC: Dental Pain   Subjective: Grady Lucci is a 42 y.o. female who presents for UC visit Her concerns today include:  HTN, HL,PKD, hepatitis C never treated, tob abuse, cervical CAtreated with LEEP procedure per patient, tobacco abuse, substance abuse currently on methadone clean since 2013..  C/o pain and swelling around tooth in RT upper jaw x 1 wk.  Tooth broke off partially. -Was not approved for OC.  She has called around to several dentist.  Plans to schedule with [facility] -taking Advil 800mg  every 5-6 hrs  BP elev.  Did not take Norvasc as yet for the day.  Checks BP 2-3 x a wk.  SBP has been 140-150 range Patient Active Problem List   Diagnosis Date Noted  . Leukocytosis 12/08/2016  . Hyperlipidemia 12/08/2016  . Hep C w/o coma, chronic ([place]) 11/10/2016  . Hx of opioid abuse 11/10/2016  . Insomnia 11/10/2016  . Polycystic kidney 11/10/2016  . Tobacco abuse 07/24/2014  . GERD (gastroesophageal reflux disease) 07/24/2014  . Essential hypertension   . Chronic back pain      Current Outpatient Medications on File Prior to Visit  Medication Sig Dispense Refill  . atorvastatin (LIPITOR) 10 MG tablet Take 1 tablet (10 mg total) by mouth daily. 90 tablet 3  . METHADONE HCL PO Take 1 tablet by mouth daily. 220mg     . ondansetron (ZOFRAN ODT) 4 MG disintegrating tablet Take 1 tablet (4 mg total) by mouth 2 (two) times daily as needed for nausea or vomiting. (Patient not taking: Reported on 06/11/2017) 15 tablet 0   No current facility-administered medications on file prior to visit.     Allergies  Allergen Reactions  . Aspirin     Kidney disease     Social History   Socioeconomic History  . Marital status: Widowed    Spouse name: Not on file  . Number of children: Not on file  . Years of education: Not on file  . Highest education level: Not on file  Occupational History  . Not on  file  Social Needs  . Financial resource strain: Not on file  . Food insecurity:    Worry: Not on file    Inability: Not on file  . Transportation needs:    Medical: Not on file    Non-medical: Not on file  Tobacco Use  . Smoking status: Current Every Day Smoker    Packs/day: 1.00    Years: 25.00    Pack years: 25.00    Types: Cigarettes  . Smokeless tobacco: Never Used  Substance and Sexual Activity  . Alcohol use: Yes    Comment: social drinker  . Drug use: No    Comment: history of drug abuse  . Sexual activity: Not on file  Lifestyle  . Physical activity:    Days per week: Not on file    Minutes per session: Not on file  . Stress: Not on file  Relationships  . Social connections:    Talks on phone: Not on file    Gets together: Not on file    Attends religious service: Not on file    Active member of club or organization: Not on file    Attends meetings of clubs or organizations: Not on file    Relationship status: Not on file  . Intimate partner violence:    Fear of current or ex partner:  Not on file    Emotionally abused: Not on file    Physically abused: Not on file    Forced sexual activity: Not on file  Other Topics Concern  . Not on file  Social History Narrative  . Not on file    Family History  Problem Relation Age of Onset  . Heart disease Mother   . Diabetes Father     Past Surgical History:  Procedure Laterality Date  . CESAREAN SECTION    . NEPHRECTOMY      ROS: Review of Systems Neg except as above PHYSICAL EXAM: BP (!) 156/99   Pulse 70   Temp 98.8 F (37.1 C) (Oral)   Resp 16   Wt 195 lb 9.6 oz (88.7 kg)   SpO2 97%   BMI 32.55 kg/m   BP 150/90 Physical Exam  General appearance - alert, well appearing, and in no distress Mental status - alert, oriented to person, place, and time, normal mood, behavior, speech, dress, motor activity, and thought processes Mouth -poor oral hygiene with several decayed tooth broken off in the  gum.  1 of the incisors in the upper jaw her left side has some surrounding erythema of the gum but no fluctuance   ASSESSMENT AND PLAN: 1. Dental cavities Patient given a course of penicillin for 7 days.  Encouraged her to schedule an appointment with a dentist as soon as possible  2. Essential hypertension Not at goal.  Stop Advil and other NSAIDs.  Use extra strength Tylenol over-the-counter as needed.  Increase Norvasc to 10 mg daily.  Continue to monitor blood pressure with goal of 130/80 or lower. - amLODipine (NORVASC) 10 MG tablet; Take 1 tablet (10 mg total) by mouth daily.  Dispense: 30 tablet; Refill: 6  Patient was given the opportunity to ask questions.  Patient verbalized understanding of the plan and was able to repeat key elements of the plan.   No orders of the defined types were placed in this encounter.    Requested Prescriptions   Signed Prescriptions Disp Refills  . amLODipine (NORVASC) 10 MG tablet 30 tablet 6    Sig: Take 1 tablet (10 mg total) by mouth daily.  . penicillin v potassium (VEETID) 500 MG tablet 21 tablet 0    Sig: Take 1 tablet (500 mg total) by mouth 3 (three) times daily.    Return if symptoms worsen or fail to improve.  Karle Plumber, MD, FACP

## 2017-07-09 ENCOUNTER — Telehealth: Payer: Self-pay | Admitting: Internal Medicine

## 2017-07-09 NOTE — Telephone Encounter (Signed)
Will forward to pcp

## 2017-07-09 NOTE — Telephone Encounter (Signed)
Pt called to say that this medication is needed prior to a tooth extraction she will be having in the next couple days. Please follow up

## 2017-07-09 NOTE — Telephone Encounter (Signed)
Patient called and requested for listed medications to be refilled and sent to [id number] pharmacy if willing to refill. Patient stated she went to dentist and he refused to pull her tooth until the swelling is gone, patient stated its not hurting like it was but her face is still swollen. Please fu at your earliest convenience.  penicillin v potassium (VEETID) 500 MG tablet [[id number]]

## 2017-07-10 MED ORDER — PENICILLIN V POTASSIUM 250 MG PO TABS: 250.0000 mg | ORAL_TABLET | Freq: Four times a day (QID) | ORAL | 0 refills | Status: DC

## 2017-07-10 MED FILL — PENICILLIN VK 250 MG TABLET: 250 | 7 days supply | Qty: 28 | Fill #0

## 2017-07-11 MED FILL — AMOXICILLIN 500 MG CAPSULE: 500 | 7 days supply | Qty: 21 | Fill #0

## 2017-10-11 IMAGING — DX DG ABDOMEN 1V
2 series · 2 of 2 positions shown · non-contrast
Comparison: None

CLINICAL DATA: Epigastric pain.  Vomiting and diarrhea.

EXAM:
ABDOMEN - 1 VIEW

[abdomen kub (1 of 2)]
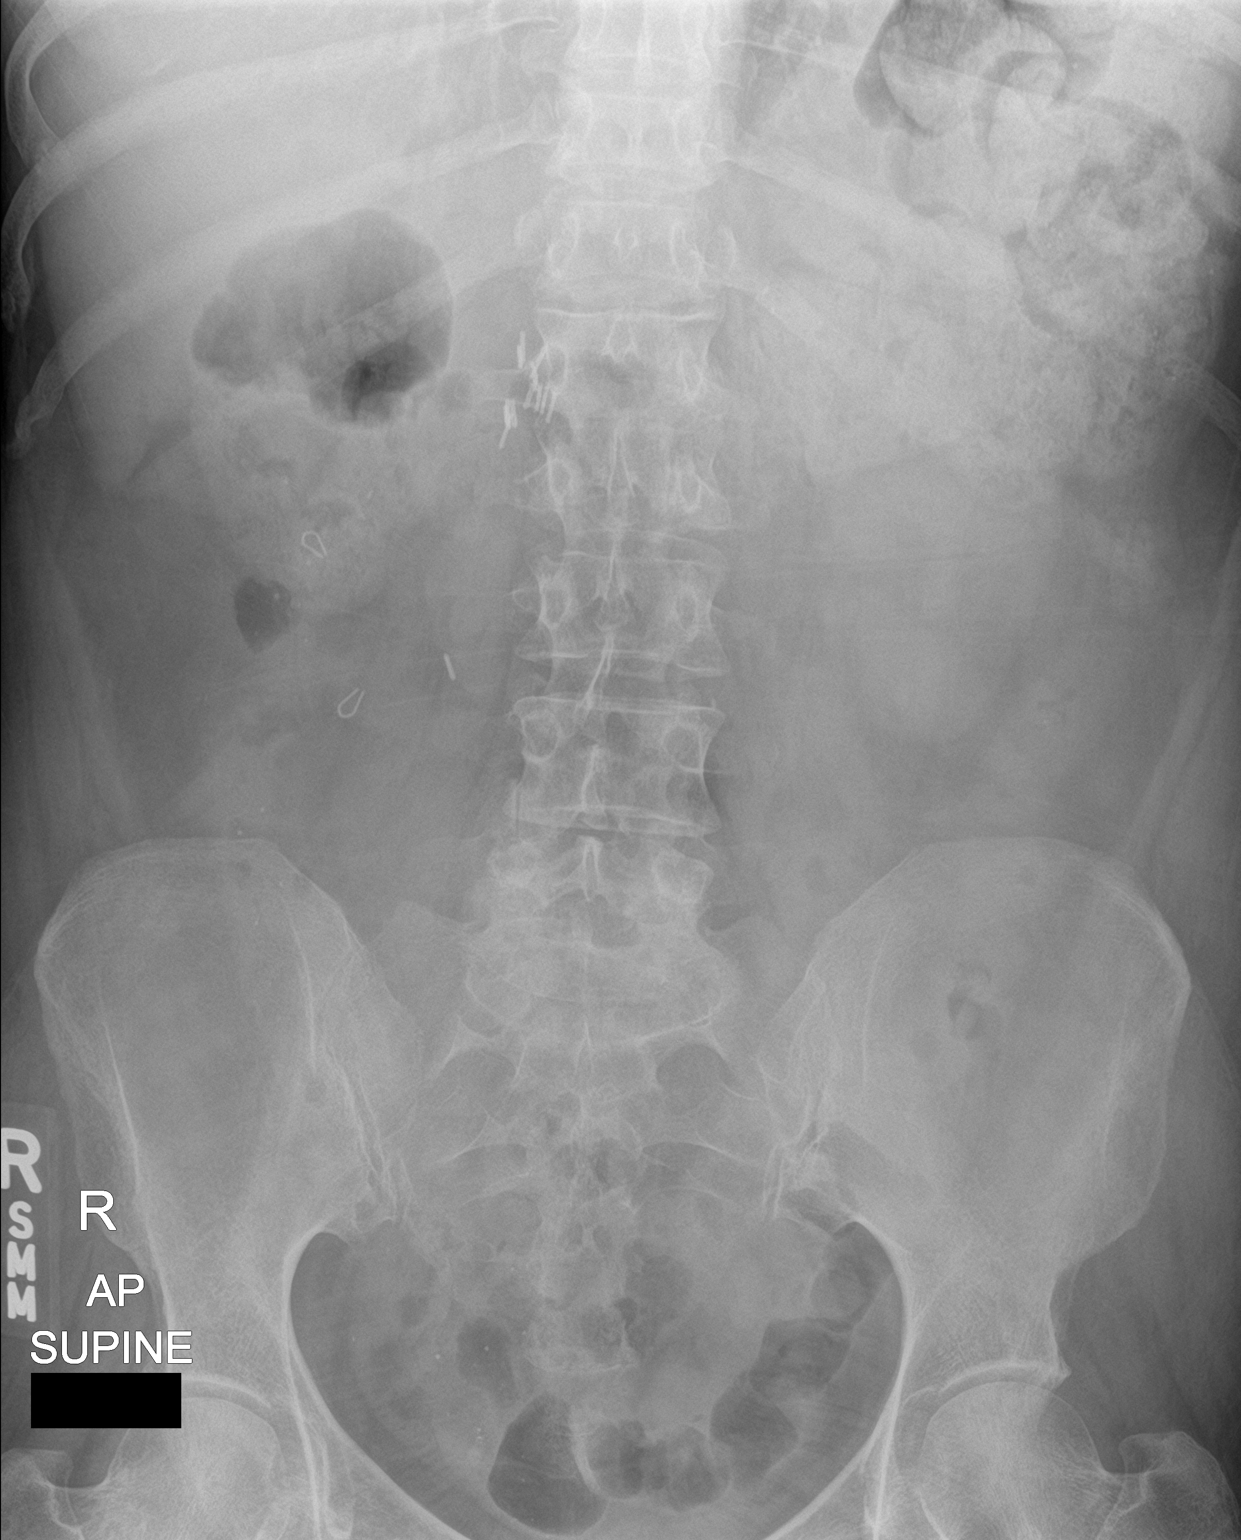

[abdomen kub (2 of 2)]
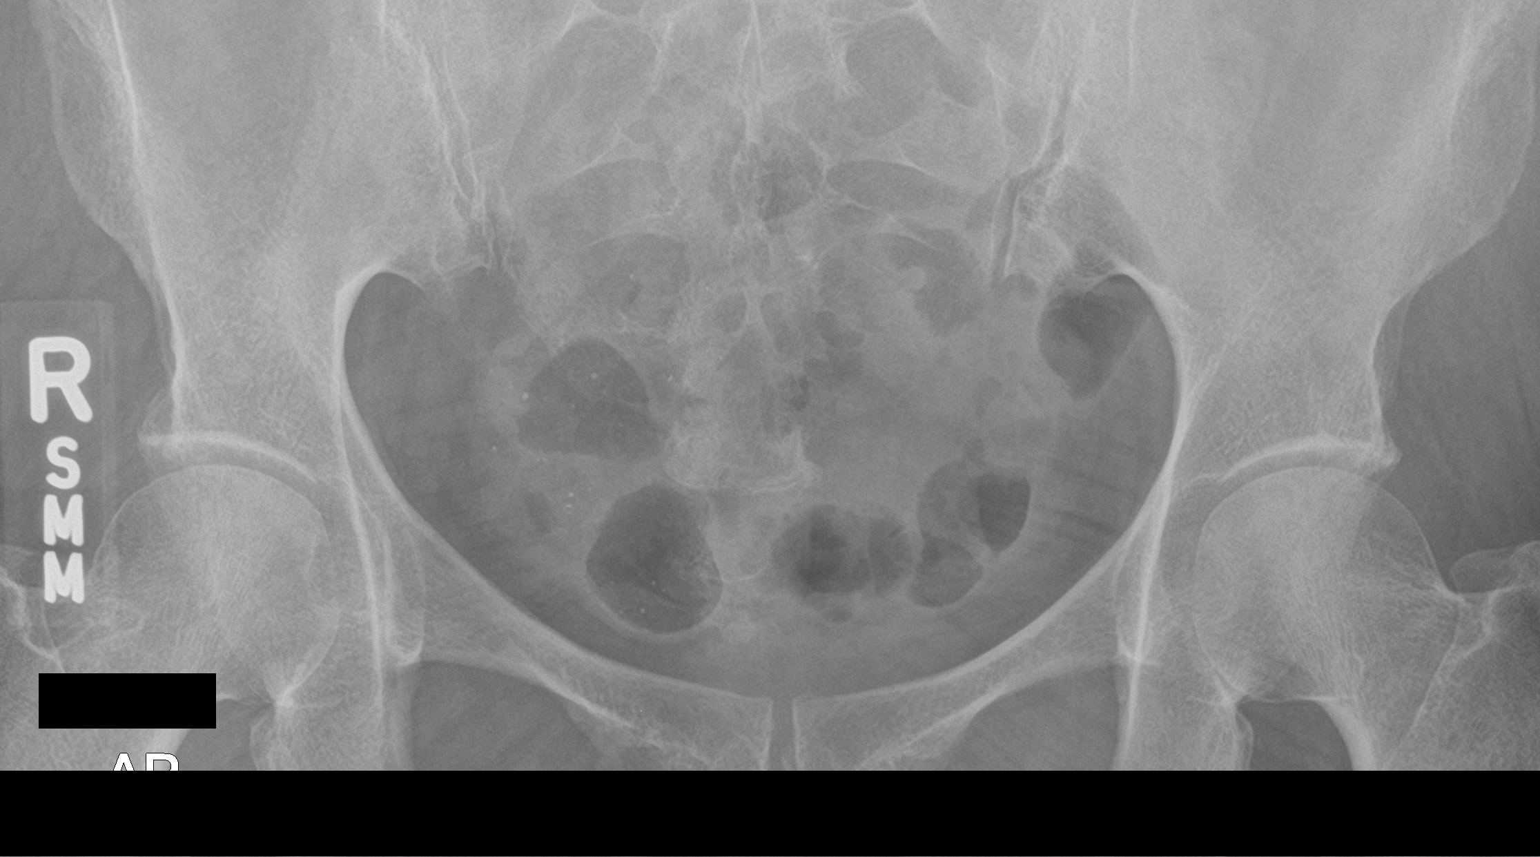

[2 of 2 positions shown; findings below may reference images not displayed]

FINDINGS: The bowel gas pattern is normal. No dilated loops of large or small
bowel. No fecal impaction. Moderate stool in the transverse colon.

Multiple surgical clips in the right mid abdomen.

No abnormal abdominal calcifications.  Bone structures are normal.
IMPRESSION: Benign-appearing abdomen.

## 2018-04-02 ENCOUNTER — Encounter: Payer: Self-pay | Admitting: Internal Medicine

## 2018-04-02 ENCOUNTER — Ambulatory Visit: Payer: Self-pay | Attending: Internal Medicine | Admitting: Internal Medicine

## 2018-04-02 VITALS — BP 175/104 | HR 66 | Temp 98.2°F | Resp 16 | Ht 65.0 in | Wt 192.8 lb

## 2018-04-02 DIAGNOSIS — K529 Noninfective gastroenteritis and colitis, unspecified: Secondary | ICD-10-CM | POA: Insufficient documentation

## 2018-04-02 DIAGNOSIS — Z8541 Personal history of malignant neoplasm of cervix uteri: Secondary | ICD-10-CM | POA: Insufficient documentation

## 2018-04-02 DIAGNOSIS — J069 Acute upper respiratory infection, unspecified: Secondary | ICD-10-CM | POA: Insufficient documentation

## 2018-04-02 DIAGNOSIS — Z886 Allergy status to analgesic agent status: Secondary | ICD-10-CM | POA: Insufficient documentation

## 2018-04-02 DIAGNOSIS — I10 Essential (primary) hypertension: Secondary | ICD-10-CM | POA: Insufficient documentation

## 2018-04-02 DIAGNOSIS — Z79899 Other long term (current) drug therapy: Secondary | ICD-10-CM | POA: Insufficient documentation

## 2018-04-02 DIAGNOSIS — Z905 Acquired absence of kidney: Secondary | ICD-10-CM | POA: Insufficient documentation

## 2018-04-02 DIAGNOSIS — Z8249 Family history of ischemic heart disease and other diseases of the circulatory system: Secondary | ICD-10-CM | POA: Insufficient documentation

## 2018-04-02 DIAGNOSIS — E785 Hyperlipidemia, unspecified: Secondary | ICD-10-CM | POA: Insufficient documentation

## 2018-04-02 DIAGNOSIS — B9789 Other viral agents as the cause of diseases classified elsewhere: Secondary | ICD-10-CM

## 2018-04-02 DIAGNOSIS — F17210 Nicotine dependence, cigarettes, uncomplicated: Secondary | ICD-10-CM | POA: Insufficient documentation

## 2018-04-02 DIAGNOSIS — B182 Chronic viral hepatitis C: Secondary | ICD-10-CM | POA: Insufficient documentation

## 2018-04-02 DIAGNOSIS — Z833 Family history of diabetes mellitus: Secondary | ICD-10-CM | POA: Insufficient documentation

## 2018-04-02 MED ORDER — AMLODIPINE BESYLATE 10 MG PO TABS: 10.0000 mg | ORAL_TABLET | Freq: Every day | ORAL | 2 refills | Status: DC

## 2018-04-02 MED ORDER — BENZONATATE 100 MG PO CAPS: 100.0000 mg | ORAL_CAPSULE | Freq: Three times a day (TID) | ORAL | 0 refills | Status: DC | PRN

## 2018-04-02 MED ORDER — LOPERAMIDE HCL 2 MG PO TABS: 2.0000 mg | ORAL_TABLET | Freq: Three times a day (TID) | ORAL | 0 refills | Status: AC | PRN

## 2018-04-02 MED ORDER — ATORVASTATIN CALCIUM 10 MG PO TABS: 10.0000 mg | ORAL_TABLET | Freq: Every day | ORAL | 1 refills | Status: DC

## 2018-04-02 NOTE — Progress Notes (Signed)
Patient ID: Felicia Hendricks, female    DOB: 07-11-1975  MRN: [id number]  CC: URI   Subjective: Felicia Hendricks is a 43 y.o. female who presents for Her concerns today include:  HTN, HL,PKD, hepatitis C never treated,tob abuse,cervical CAtreated with LEEP procedure per patient, tobacco abuse, substance abuse currently on methadone clean since 2013.Marland Kitchen  Pt c/o feeling sick x 2 wks with a cold.  She is having cough, congestion and shortness of breath with coughing, ear pain and had subjective fever last week.  She took NyQuil and DayQuil for the first week and thought she was getting better.  However she feels that the cough has gotten worse again.  She started taking Mucinex.  She had some vomiting last night and started having diarrhea this morning.  She has had 3 loose bowel movements so far for today.  She denies any abdominal pain.  Blood pressure noted to be elevated.  She has not a can amlodipine as yet and is requesting a refill.  She is also requesting refill on cholesterol medication atorvastatin. Patient Active Problem List   Diagnosis Date Noted  . Leukocytosis 12/08/2016  . Hyperlipidemia 12/08/2016  . Hep C w/o coma, chronic ([place]) 11/10/2016  . Hx of opioid abuse ([place]) 11/10/2016  . Insomnia 11/10/2016  . Polycystic kidney 11/10/2016  . Tobacco abuse 07/24/2014  . GERD (gastroesophageal reflux disease) 07/24/2014  . Essential hypertension   . Chronic back pain      Current Outpatient Medications on File Prior to Visit  Medication Sig Dispense Refill  . METHADONE HCL PO Take 1 tablet by mouth daily. 220mg      No current facility-administered medications on file prior to visit.     Allergies  Allergen Reactions  . Aspirin     Kidney disease     Social History   Socioeconomic History  . Marital status: Widowed    Spouse name: Not on file  . Number of children: Not on file  . Years of education: Not on file  . Highest education level: Not on file    Occupational History  . Not on file  Social Needs  . Financial resource strain: Not on file  . Food insecurity:    Worry: Not on file    Inability: Not on file  . Transportation needs:    Medical: Not on file    Non-medical: Not on file  Tobacco Use  . Smoking status: Current Every Day Smoker    Packs/day: 1.00    Years: 25.00    Pack years: 25.00    Types: Cigarettes  . Smokeless tobacco: Never Used  Substance and Sexual Activity  . Alcohol use: Yes    Comment: social drinker  . Drug use: No    Comment: history of drug abuse  . Sexual activity: Not on file  Lifestyle  . Physical activity:    Days per week: Not on file    Minutes per session: Not on file  . Stress: Not on file  Relationships  . Social connections:    Talks on phone: Not on file    Gets together: Not on file    Attends religious service: Not on file    Active member of club or organization: Not on file    Attends meetings of clubs or organizations: Not on file    Relationship status: Not on file  . Intimate partner violence:    Fear of current or ex partner: Not on file  Emotionally abused: Not on file    Physically abused: Not on file    Forced sexual activity: Not on file  Other Topics Concern  . Not on file  Social History Narrative  . Not on file    Family History  Problem Relation Age of Onset  . Heart disease Mother   . Diabetes Father     Past Surgical History:  Procedure Laterality Date  . CESAREAN SECTION    . NEPHRECTOMY      ROS: Review of Systems Negative except as above PHYSICAL EXAM: BP (!) 175/104   Pulse 66   Temp 98.2 F (36.8 C) (Oral)   Resp 16   Ht 5\' 5"  (1.651 m)   Wt 192 lb 12.8 oz (87.5 kg)   SpO2 97%   BMI 32.08 kg/m   Physical Exam  General appearance - alert, well appearing, and in no distress.  Patient with mild audible congestion Mental status - normal mood, behavior, speech, dress, motor activity, and thought processes Nose - normal and  patent, no erythema, discharge or polyps Mouth - mucous membranes moist, pharynx normal without lesions Neck - supple, no significant adenopathy Chest - clear to auscultation, no wheezes, rales or rhonchi, symmetric air entry Abdomen -normal bowel sounds, nondistended, soft and nontender Heart - normal rate, regular rhythm, normal S1, S2, no murmurs, rubs, clicks or gallops Extremities - peripheral pulses normal, no pedal edema, no clubbing or cyanosis    ASSESSMENT AND PLAN:  1. Viral upper respiratory tract infection Recommend using some Vicks vapor rub on the chest to help decrease congestion - benzonatate (TESSALON) 100 MG capsule; Take 1 capsule (100 mg total) by mouth 3 (three) times daily as needed for cough.  Dispense: 30 capsule; Refill: 0  2. Gastroenteritis Recommend pushing fluids. - loperamide (IMODIUM A-D) 2 MG tablet; Take 1 tablet (2 mg total) by mouth 3 (three) times daily as needed for diarrhea or loose stools.  Dispense: 20 tablet; Refill: 0  3. Essential hypertension Not at goal.  Patient needing refill on amlodipine - amLODipine (NORVASC) 10 MG tablet; Take 1 tablet (10 mg total) by mouth daily.  Dispense: 30 tablet; Refill: 2  4. Hyperlipidemia, unspecified hyperlipidemia type - atorvastatin (LIPITOR) 10 MG tablet; Take 1 tablet (10 mg total) by mouth daily.  Dispense: 30 tablet; Refill: 1   Patient was given the opportunity to ask questions.  Patient verbalized understanding of the plan and was able to repeat key elements of the plan.   No orders of the defined types were placed in this encounter.    Requested Prescriptions   Signed Prescriptions Disp Refills  . atorvastatin (LIPITOR) 10 MG tablet 30 tablet 1    Sig: Take 1 tablet (10 mg total) by mouth daily.  . benzonatate (TESSALON) 100 MG capsule 30 capsule 0    Sig: Take 1 capsule (100 mg total) by mouth 3 (three) times daily as needed for cough.  . loperamide (IMODIUM A-D) 2 MG tablet 20 tablet 0      Sig: Take 1 tablet (2 mg total) by mouth 3 (three) times daily as needed for diarrhea or loose stools.  Marland Kitchen amLODipine (NORVASC) 10 MG tablet 30 tablet 2    Sig: Take 1 tablet (10 mg total) by mouth daily.    Return in about 6 weeks (around 05/14/2018).  Karle Plumber, MD, FACP

## 2018-04-02 NOTE — Patient Instructions (Signed)

## 2018-04-03 MED FILL — AMLODIPINE BESYLATE 10 MG T: 10 | 30 days supply | Qty: 30 | Fill #0

## 2018-04-03 MED FILL — ATORVASTATIN 10 MG TABLET: 10 | 30 days supply | Qty: 30 | Fill #0

## 2018-04-03 MED FILL — BENZONATATE 100 MG CAP: 100 | 10 days supply | Qty: 30 | Fill #0

## 2018-04-04 ENCOUNTER — Telehealth: Payer: Self-pay | Admitting: Internal Medicine

## 2018-04-04 ENCOUNTER — Ambulatory Visit: Payer: Self-pay | Admitting: Internal Medicine

## 2018-04-04 NOTE — Telephone Encounter (Signed)
Patient called to cancel their appointment because they do not feel well enough to come in. Patient would like to see if she can be prescribed zofran because she was vomitting and would also like a substitution  for the coughing. Please follow up.

## 2018-04-05 NOTE — Telephone Encounter (Signed)
Will forward to pcp

## 2018-04-06 MED ORDER — ONDANSETRON HCL 4 MG PO TABS: 4.0000 mg | ORAL_TABLET | Freq: Three times a day (TID) | ORAL | 0 refills | Status: DC | PRN

## 2018-04-08 NOTE — Telephone Encounter (Signed)
Pt  Is aware of Zofran being ready at the pharmacy but still wants something for her persistent cough, please follow up to  -[place]

## 2018-04-09 MED FILL — ONDANSETRON HCL 4 MG TABLET: 4 | 4 days supply | Qty: 12 | Fill #0

## 2018-04-09 NOTE — Telephone Encounter (Signed)
Will forward to pcp

## 2018-04-10 NOTE — Telephone Encounter (Signed)
Called several times and LVM to call back and schedule an appointment

## 2018-04-12 ENCOUNTER — Ambulatory Visit: Payer: Self-pay | Attending: Internal Medicine | Admitting: Internal Medicine

## 2018-04-12 ENCOUNTER — Encounter: Payer: Self-pay | Admitting: Internal Medicine

## 2018-04-12 VITALS — BP 153/91 | HR 53 | Temp 98.4°F | Resp 16 | Wt 193.2 lb

## 2018-04-12 DIAGNOSIS — Z886 Allergy status to analgesic agent status: Secondary | ICD-10-CM | POA: Insufficient documentation

## 2018-04-12 DIAGNOSIS — Z8249 Family history of ischemic heart disease and other diseases of the circulatory system: Secondary | ICD-10-CM | POA: Insufficient documentation

## 2018-04-12 DIAGNOSIS — Z833 Family history of diabetes mellitus: Secondary | ICD-10-CM | POA: Insufficient documentation

## 2018-04-12 DIAGNOSIS — F17210 Nicotine dependence, cigarettes, uncomplicated: Secondary | ICD-10-CM | POA: Insufficient documentation

## 2018-04-12 DIAGNOSIS — E785 Hyperlipidemia, unspecified: Secondary | ICD-10-CM | POA: Insufficient documentation

## 2018-04-12 DIAGNOSIS — Z79899 Other long term (current) drug therapy: Secondary | ICD-10-CM | POA: Insufficient documentation

## 2018-04-12 DIAGNOSIS — G4700 Insomnia, unspecified: Secondary | ICD-10-CM | POA: Insufficient documentation

## 2018-04-12 DIAGNOSIS — I10 Essential (primary) hypertension: Secondary | ICD-10-CM | POA: Insufficient documentation

## 2018-04-12 DIAGNOSIS — J209 Acute bronchitis, unspecified: Secondary | ICD-10-CM | POA: Insufficient documentation

## 2018-04-12 DIAGNOSIS — Z905 Acquired absence of kidney: Secondary | ICD-10-CM | POA: Insufficient documentation

## 2018-04-12 DIAGNOSIS — R0981 Nasal congestion: Secondary | ICD-10-CM

## 2018-04-12 MED ORDER — BENZONATATE 100 MG PO CAPS: 100.0000 mg | ORAL_CAPSULE | Freq: Three times a day (TID) | ORAL | 0 refills | Status: DC | PRN

## 2018-04-12 MED ORDER — LORATADINE 10 MG PO TABS: 10.0000 mg | ORAL_TABLET | Freq: Every day | ORAL | 0 refills | Status: AC

## 2018-04-12 MED ORDER — DOXYCYCLINE HYCLATE 100 MG PO TABS: 100.0000 mg | ORAL_TABLET | Freq: Two times a day (BID) | ORAL | 0 refills | Status: DC

## 2018-04-12 MED ORDER — FLUTICASONE PROPIONATE 50 MCG/ACT NA SUSP
1.0000 | Freq: Every day | NASAL | 0 refills | Status: DC
Start: 2018-04-12 — End: 2018-10-07

## 2018-04-12 MED FILL — BENZONATATE 100 MG CAP: 100 | 10 days supply | Qty: 30 | Fill #0

## 2018-04-12 MED FILL — DOXYCYCLINE HYCLATE 100 MG: 100 | 7 days supply | Qty: 14 | Fill #0

## 2018-04-12 MED FILL — FLUTICASONE PROP 50 MCG SPR: 50 | 30 days supply | Qty: 16 | Fill #0

## 2018-04-12 NOTE — Progress Notes (Signed)
Patient ID: Felicia Hendricks, female    DOB: 01/26/1976  MRN: [id number]  CC: Cough   Subjective: Felicia Hendricks is a 43 y.o. female who presents for persistent cough Her concerns today include:  HTN, HL,PKD, hepatitis C never treated,tob abuse,cervical CAtreated with LEEP procedure per patient, tobacco abuse, substance abuse currently on methadone clean since 2013.Marland Kitchen  Patient was seen on the 21st of this month with upper respiratory symptoms x2 weeks.  She was diagnosed with having a URI and conservative measures recommended.  Since that time patient states that she is feeling no better.  Cough persists and at 1 point she was coughing so hard that was causing her to throw up.  Persistent cough is also causing headache.  She denies any fever.  Cough and congestion is worse at night but the congestion feels more so in the sinuses than in the chest.  Blood pressure noted to be elevated today.  She has taken amlodipine already for today.  She feels it is elevated because she has been coughing so much.    Patient Active Problem List   Diagnosis Date Noted  . Leukocytosis 12/08/2016  . Hyperlipidemia 12/08/2016  . Hep C w/o coma, chronic ([place]) 11/10/2016  . Hx of opioid abuse ([place]) 11/10/2016  . Insomnia 11/10/2016  . Polycystic kidney 11/10/2016  . Tobacco abuse 07/24/2014  . GERD (gastroesophageal reflux disease) 07/24/2014  . Essential hypertension   . Chronic back pain      Current Outpatient Medications on File Prior to Visit  Medication Sig Dispense Refill  . amLODipine (NORVASC) 10 MG tablet Take 1 tablet (10 mg total) by mouth daily. 30 tablet 2  . atorvastatin (LIPITOR) 10 MG tablet Take 1 tablet (10 mg total) by mouth daily. 30 tablet 1  . loperamide (IMODIUM A-D) 2 MG tablet Take 1 tablet (2 mg total) by mouth 3 (three) times daily as needed for diarrhea or loose stools. 20 tablet 0  . METHADONE HCL PO Take 1 tablet by mouth daily. 210mg     . ondansetron (ZOFRAN) 4 MG  tablet Take 1 tablet (4 mg total) by mouth every 8 (eight) hours as needed for nausea or vomiting. 12 tablet 0   No current facility-administered medications on file prior to visit.     Allergies  Allergen Reactions  . Aspirin     Kidney disease     Social History   Socioeconomic History  . Marital status: Widowed    Spouse name: Not on file  . Number of children: Not on file  . Years of education: Not on file  . Highest education level: Not on file  Occupational History  . Not on file  Social Needs  . Financial resource strain: Not on file  . Food insecurity:    Worry: Not on file    Inability: Not on file  . Transportation needs:    Medical: Not on file    Non-medical: Not on file  Tobacco Use  . Smoking status: Current Every Day Smoker    Packs/day: 1.00    Years: 25.00    Pack years: 25.00    Types: Cigarettes  . Smokeless tobacco: Never Used  Substance and Sexual Activity  . Alcohol use: Yes    Comment: social drinker  . Drug use: No    Comment: history of drug abuse  . Sexual activity: Not on file  Lifestyle  . Physical activity:    Days per week: Not on file  Minutes per session: Not on file  . Stress: Not on file  Relationships  . Social connections:    Talks on phone: Not on file    Gets together: Not on file    Attends religious service: Not on file    Active member of club or organization: Not on file    Attends meetings of clubs or organizations: Not on file    Relationship status: Not on file  . Intimate partner violence:    Fear of current or ex partner: Not on file    Emotionally abused: Not on file    Physically abused: Not on file    Forced sexual activity: Not on file  Other Topics Concern  . Not on file  Social History Narrative  . Not on file    Family History  Problem Relation Age of Onset  . Heart disease Mother   . Diabetes Father     Past Surgical History:  Procedure Laterality Date  . CESAREAN SECTION    .  NEPHRECTOMY      ROS: Review of Systems Negative except as above. PHYSICAL EXAM: BP (!) 153/91   Pulse (!) 53   Temp 98.4 F (36.9 C) (Oral)   Resp 16   Wt 193 lb 3.2 oz (87.6 kg)   SpO2 98%   BMI 32.15 kg/m   Physical Exam Repeat BP 140/90 General appearance - alert, well appearing, and in no distress.  Patient has mild audible congestion and mild hoarseness Mental status - normal mood, behavior, speech, dress, motor activity, and thought processes Nose - normal and patent, no erythema, discharge or polyps Mouth - mucous membranes moist, pharynx normal without lesions Neck - supple, no significant adenopathy Chest - clear to auscultation, no wheezes, rales or rhonchi, symmetric air entry Heart - normal rate, regular rhythm, normal S1, S2, no murmurs, rubs, clicks or gallops   ASSESSMENT AND PLAN:  1. Acute bronchitis, unspecified organism - benzonatate (TESSALON) 100 MG capsule; Take 1 capsule (100 mg total) by mouth 3 (three) times daily as needed for cough.  Dispense: 30 capsule; Refill: 0 - doxycycline (VIBRA-TABS) 100 MG tablet; Take 1 tablet (100 mg total) by mouth 2 (two) times daily.  Dispense: 14 tablet; Refill: 0  2. Sinus congestion - loratadine (CLARITIN) 10 MG tablet; Take 1 tablet (10 mg total) by mouth daily.  Dispense: 30 tablet; Refill: 0 - fluticasone (FLONASE) 50 MCG/ACT nasal spray; Place 1 spray into both nostrils daily.  Dispense: 16 g; Refill: 0  3. Essential hypertension Not at goal.  Patient declined having medication added today.  She states she will monitor her blood pressure when she gets over this acute illness and see whether it improves.   Patient was given the opportunity to ask questions.  Patient verbalized understanding of the plan and was able to repeat key elements of the plan.   No orders of the defined types were placed in this encounter.    Requested Prescriptions   Signed Prescriptions Disp Refills  . benzonatate (TESSALON)  100 MG capsule 30 capsule 0    Sig: Take 1 capsule (100 mg total) by mouth 3 (three) times daily as needed for cough.  . loratadine (CLARITIN) 10 MG tablet 30 tablet 0    Sig: Take 1 tablet (10 mg total) by mouth daily.  . fluticasone (FLONASE) 50 MCG/ACT nasal spray 16 g 0    Sig: Place 1 spray into both nostrils daily.  Marland Kitchen doxycycline (VIBRA-TABS) 100 MG tablet  14 tablet 0    Sig: Take 1 tablet (100 mg total) by mouth 2 (two) times daily.    Return if symptoms worsen or fail to improve.  Karle Plumber, MD, FACP

## 2018-05-17 ENCOUNTER — Ambulatory Visit: Payer: Self-pay | Admitting: Internal Medicine

## 2018-06-18 ENCOUNTER — Ambulatory Visit (HOSPITAL_BASED_OUTPATIENT_CLINIC_OR_DEPARTMENT_OTHER): Payer: Self-pay | Admitting: Licensed Clinical Social Worker

## 2018-06-18 ENCOUNTER — Ambulatory Visit: Payer: Self-pay | Attending: Internal Medicine | Admitting: Internal Medicine

## 2018-06-18 ENCOUNTER — Other Ambulatory Visit: Payer: Self-pay

## 2018-06-18 DIAGNOSIS — Z6379 Other stressful life events affecting family and household: Secondary | ICD-10-CM

## 2018-06-18 DIAGNOSIS — F329 Major depressive disorder, single episode, unspecified: Secondary | ICD-10-CM

## 2018-06-18 MED ORDER — SERTRALINE HCL 50 MG PO TABS: ORAL_TABLET | ORAL | 2 refills | Status: DC
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
## 2018-06-18 NOTE — Progress Notes (Signed)
Pt states her stepmother past away on the 12th of March  Pt states her step mother was taking care of her dad so now she has to take care of them

## 2018-06-20 ENCOUNTER — Other Ambulatory Visit: Payer: Self-pay
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
## 2018-06-26 MED FILL — SERTRALINE HCL 50 MG TABS: 50 | 37 days supply | Qty: 30 | Fill #0

## 2018-09-05 ENCOUNTER — Other Ambulatory Visit (HOSPITAL_COMMUNITY): Payer: Self-pay

## 2018-09-06 ENCOUNTER — Other Ambulatory Visit (HOSPITAL_COMMUNITY): Payer: Self-pay

## 2018-10-07 ENCOUNTER — Ambulatory Visit: Payer: Self-pay | Attending: Internal Medicine | Admitting: Internal Medicine

## 2018-10-07 ENCOUNTER — Encounter: Payer: Self-pay | Admitting: Internal Medicine

## 2018-10-07 ENCOUNTER — Other Ambulatory Visit: Payer: Self-pay

## 2018-10-07 ENCOUNTER — Ambulatory Visit: Payer: Self-pay

## 2018-10-07 DIAGNOSIS — R739 Hyperglycemia, unspecified: Secondary | ICD-10-CM

## 2018-10-07 DIAGNOSIS — Z720 Tobacco use: Secondary | ICD-10-CM

## 2018-10-07 DIAGNOSIS — E785 Hyperlipidemia, unspecified: Secondary | ICD-10-CM

## 2018-10-07 DIAGNOSIS — Q613 Polycystic kidney, unspecified: Secondary | ICD-10-CM

## 2018-10-07 DIAGNOSIS — B182 Chronic viral hepatitis C: Secondary | ICD-10-CM

## 2018-10-07 DIAGNOSIS — I10 Essential (primary) hypertension: Secondary | ICD-10-CM

## 2018-10-07 DIAGNOSIS — F329 Major depressive disorder, single episode, unspecified: Secondary | ICD-10-CM

## 2018-10-07 MED ORDER — SERTRALINE HCL 100 MG PO TABS: 100.0000 mg | ORAL_TABLET | Freq: Every day | ORAL | 2 refills | Status: DC

## 2018-10-07 MED ORDER — LISINOPRIL 5 MG PO TABS: 5.0000 mg | ORAL_TABLET | Freq: Every day | ORAL | 5 refills | Status: AC

## 2018-10-07 MED ORDER — ATORVASTATIN CALCIUM 10 MG PO TABS: 10.0000 mg | ORAL_TABLET | Freq: Every day | ORAL | 6 refills | Status: DC

## 2018-10-07 MED ORDER — AMLODIPINE BESYLATE 10 MG PO TABS: 10.0000 mg | ORAL_TABLET | Freq: Every day | ORAL | 5 refills | Status: AC

## 2018-10-07 MED FILL — SERTRALINE HCL 100 MG TAB: 100 | 30 days supply | Qty: 30 | Fill #0

## 2018-10-07 MED FILL — LISINOPRIL 5 MG TAB: 5 | 30 days supply | Qty: 30 | Fill #0

## 2018-10-07 MED FILL — AMLODIPINE BESYLATE 10 MG T: 10 | 30 days supply | Qty: 30 | Fill #0

## 2018-10-07 NOTE — Progress Notes (Signed)
Med refills

## 2018-10-07 NOTE — Progress Notes (Signed)
Virtual Visit via Telephone Note Due to current restrictions/limitations of in-office visits due to the COVID-19 pandemic, this scheduled clinical appointment was converted to a telehealth visit  I connected with Felicia Hendricks on 10/07/18 at 3:59 p.m by telephone and verified that I am speaking with the correct person using two identifiers. I am in my office.  The patient is at home.  Only the patient and myself participated in this encounter.  I discussed the limitations, risks, security and privacy concerns of performing an evaluation and management service by telephone and the availability of in person appointments. I also discussed with the patient that there may be a patient responsible charge related to this service. The patient expressed understanding and agreed to proceed.  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 Patient was last evaluated 06/2018.  Today's visit is for chronic ds management.  Reactive Depression:  Since our last visit, her father died.  She reports good family support.  Her son has moved from [place] to be with her.  Found Zoloft very healpful but feels that she needs a higher dose.  She has been out of it for about a week.  Denies any suicidal ideation at this time.  HYPERTENSION/PKD Currently taking: see medication list Med Adherence: [x]  Yes    []  No Medication side effects: []  Yes    [x]  No Adherence with salt restriction: [x]  Yes    []  No Home Monitoring?: [x]  Yes    []  No Monitoring Frequency:  Every evening Home BP results range:  130/90s SOB? []  Yes    [x]  No Chest Pain?: []  Yes    [x]  No Leg swelling?: []  Yes    [x]  No Headaches?: []  Yes    [x]  No Dizziness? []  Yes    [x]  No Comments: no blood in urine.  Was followed by urology in past for PKD.  Last seen 6 yrs   Tob  Dep:  Smoking 1 pk a day.  Smoked for 30 yrs.  Not ready to quit.  She is aware of the health risks associated with smoking.  HL: Needing refill on atorvastatin.  History of chronic hepatitis C: Never treated but would like to pursue it.  Currently does not have the orange card or cone discount. -She is still on methadone.  She tells me that the current dose is now 145 mg a day  Outpatient Encounter Medications as of 10/07/2018  Medication Sig  . amLODipine (NORVASC) 10 MG tablet Take 1 tablet (10 mg total) by mouth daily.  Marland Kitchen atorvastatin (LIPITOR) 10 MG tablet Take 1 tablet (10 mg total) by mouth daily.  Marland Kitchen loperamide (IMODIUM A-D) 2 MG tablet Take 1 tablet (2 mg total) by mouth 3 (three) times daily as needed for diarrhea or loose stools.  Marland Kitchen loratadine (CLARITIN) 10 MG tablet Take 1 tablet (10 mg total) by mouth daily.  Marland Kitchen METHADONE HCL PO Take 1 tablet by mouth daily. 145 mg  . sertraline (ZOLOFT) 50 MG tablet 1/2 tab daily PO x 2 wks then 1 tab Po daily thereafter and on subsequent refills (Patient taking differently: Take 50 mg by mouth daily. )  . benzonatate (TESSALON) 100 MG capsule Take 1 capsule (100 mg total) by mouth 3 (three) times daily as needed for cough. (Patient not taking: Reported on 10/07/2018)  . doxycycline (VIBRA-TABS) 100 MG tablet Take 1  tablet (100 mg total) by mouth 2 (two) times daily. (Patient not taking: Reported on 06/18/2018)  . fluticasone (FLONASE) 50 MCG/ACT nasal spray Place 1 spray into both nostrils daily. (Patient not taking: Reported on 10/07/2018)  . ondansetron (ZOFRAN) 4 MG tablet Take 1 tablet (4 mg total) by mouth every 8 (eight) hours as needed for nausea or vomiting. (Patient not taking: Reported on 06/18/2018)   No facility-administered encounter medications on file as of 10/07/2018.       Observations/Objective: Lab Results  Component Value Date   WBC 11.6 (H) 12/08/2016   HGB 12.6 12/08/2016   HCT 39.0 12/08/2016   MCV 87 12/08/2016   PLT 339  11/10/2016     Chemistry      Component Value Date/Time   NA 148 (H) 11/10/2016 1643   K 3.9 11/10/2016 1643   CL 106 11/10/2016 1643   CO2 21 11/10/2016 1643   BUN 13 11/10/2016 1643   CREATININE 1.12 (H) 11/10/2016 1643      Component Value Date/Time   CALCIUM 9.6 11/10/2016 1643   ALKPHOS 115 11/10/2016 1643   AST 21 11/10/2016 1643   ALT 18 11/10/2016 1643   BILITOT <0.2 11/10/2016 1643       Assessment and Plan: 1. Essential hypertension Not at goal of 130/80 or lower.  Continue Norvasc.  Add low-dose lisinopril. - Comprehensive metabolic panel; Future - Lipid panel; Future - CBC; Future - amLODipine (NORVASC) 10 MG tablet; Take 1 tablet (10 mg total) by mouth daily.  Dispense: 30 tablet; Refill: 5 - lisinopril (ZESTRIL) 5 MG tablet; Take 1 tablet (5 mg total) by mouth daily.  Dispense: 30 tablet; Refill: 5  2. Reactive depression Patient reports that she is doing well on the medication and request higher dose.  We will increase it to 100 mg.  She continues to see her counselor through the methadone clinic. - sertraline (ZOLOFT) 100 MG tablet; Take 1 tablet (100 mg total) by mouth daily.  Dispense: 30 tablet; Refill: 2  3. Hyperlipidemia, unspecified hyperlipidemia type - atorvastatin (LIPITOR) 10 MG tablet; Take 1 tablet (10 mg total) by mouth daily.  Dispense: 30 tablet; Refill: 6  4. Tobacco abuse Patient advised to quit smoking. Discussed health risks associated with smoking including lung and other types of cancers, chronic lung diseases and CV risks.. Pt not ready to give trail of quitting.  Less than 5 minutes spent on counseling.  5. Hep C w/o coma, chronic (HCC) Encourage patient to apply for the orange card/cone discount card.  Once approved we can refer her to infectious disease specialist  6. Polycystic kidney disease Patient will come to the lab this week to have blood test done including chemistry   Follow Up Instructions: F/u in 3 mths   I  discussed the assessment and treatment plan with the patient. The patient was provided an opportunity to ask questions and all were answered. The patient agreed with the plan and demonstrated an understanding of the instructions.   The patient was advised to call back or seek an in-person evaluation if the symptoms worsen or if the condition fails to improve as anticipated.  I provided 16 minutes of non-face-to-face time during this encounter.   Karle Plumber, MD

## 2018-10-08 LAB — LIPID PANEL
Chol/HDL Ratio: 5.9 ratio — ABNORMAL HIGH (ref 0.0–4.4)
Cholesterol, Total: 211 mg/dL — ABNORMAL HIGH (ref 100–199)
HDL: 36 mg/dL — ABNORMAL LOW (ref >39)
LDL Calculated: 107 mg/dL — ABNORMAL HIGH (ref 0–99)
Triglycerides: 342 mg/dL — ABNORMAL HIGH (ref 0–149)
VLDL Cholesterol Cal: 68 mg/dL — ABNORMAL HIGH (ref 5–40)

## 2018-10-08 LAB — COMPREHENSIVE METABOLIC PANEL
ALT: 18 IU/L (ref 0–32)
AST: 19 IU/L (ref 0–40)
Albumin/Globulin Ratio: 1.7 (ref 1.2–2.2)
Albumin: 4.3 g/dL (ref 3.8–4.8)
Alkaline Phosphatase: 136 IU/L — ABNORMAL HIGH (ref 39–117)
BUN/Creatinine Ratio: 11 (ref 9–23)
BUN: 10 mg/dL (ref 6–24)
Bilirubin Total: <0.2 mg/dL (ref 0.0–1.2)
CO2: 19 mmol/L — ABNORMAL LOW (ref 20–29)
Calcium: 9.2 mg/dL (ref 8.7–10.2)
Chloride: 104 mmol/L (ref 96–106)
Creatinine, Ser: 0.91 mg/dL (ref 0.57–1.00)
GFR calc Af Amer: 89 mL/min/{1.73_m2} (ref >59)
GFR calc non Af Amer: 77 mL/min/{1.73_m2} (ref >59)
Globulin, Total: 2.5 g/dL (ref 1.5–4.5)
Glucose: 120 mg/dL — ABNORMAL HIGH (ref 65–99)
Potassium: 3.8 mmol/L (ref 3.5–5.2)
Sodium: 141 mmol/L (ref 134–144)
Total Protein: 6.8 g/dL (ref 6.0–8.5)

## 2018-10-08 LAB — CBC
Hematocrit: 42.4 % (ref 34.0–46.6)
Hemoglobin: 13.7 g/dL (ref 11.1–15.9)
MCH: 27.6 pg (ref 26.6–33.0)
MCHC: 32.3 g/dL (ref 31.5–35.7)
MCV: 85 fL (ref 79–97)
Platelets: 249 10*3/uL (ref 150–450)
RBC: 4.97 x10E6/uL (ref 3.77–5.28)
RDW: 14.6 % (ref 11.7–15.4)
WBC: 10.8 10*3/uL (ref 3.4–10.8)

## 2018-10-08 MED ORDER — ATORVASTATIN CALCIUM 20 MG PO TABS: 20.0000 mg | ORAL_TABLET | Freq: Every day | ORAL | 3 refills | Status: DC

## 2018-10-08 MED FILL — ATORVASTATIN 20 MG TABLET: 20 | 30 days supply | Qty: 30 | Fill #0

## 2018-10-08 NOTE — Addendum Note (Signed)
Addended by: Carilyn Goodpasture on: 10/08/2018 02:27 PM   Modules accepted: Orders

## 2018-10-08 NOTE — Addendum Note (Signed)
Addended by: Karle Plumber B on: 10/08/2018 12:18 PM   Modules accepted: Orders

## 2018-10-10 LAB — SPECIMEN STATUS REPORT

## 2018-10-10 LAB — HEMOGLOBIN A1C
Est. average glucose Bld gHb Est-mCnc: 128 mg/dL
Hgb A1c MFr Bld: 6.1 % — ABNORMAL HIGH (ref 4.8–5.6)

## 2018-10-11 ENCOUNTER — Other Ambulatory Visit: Payer: Self-pay | Admitting: Internal Medicine

## 2018-10-11 DIAGNOSIS — R7303 Prediabetes: Secondary | ICD-10-CM | POA: Insufficient documentation

## 2018-10-11 MED ORDER — METFORMIN HCL 500 MG PO TABS: 250.0000 mg | ORAL_TABLET | Freq: Every day | ORAL | 3 refills | Status: AC

## 2018-10-18 ENCOUNTER — Telehealth: Payer: Self-pay

## 2018-10-18 NOTE — Telephone Encounter (Signed)
Contacted pt to go over lab results pt didn't answer and was unable to lvm due to vm being full

## 2018-11-03 ENCOUNTER — Encounter (HOSPITAL_COMMUNITY): Payer: Self-pay | Admitting: Emergency Medicine

## 2018-11-03 ENCOUNTER — Emergency Department (HOSPITAL_COMMUNITY): Payer: Self-pay

## 2018-11-03 ENCOUNTER — Other Ambulatory Visit: Payer: Self-pay

## 2018-11-03 ENCOUNTER — Emergency Department (HOSPITAL_COMMUNITY)
Admission: EM | Admit: 2018-11-03 | Discharge: 2018-11-03 | Disposition: A | Discharge status: Home / Self Care | Payer: Self-pay | Attending: Emergency Medicine | Admitting: Emergency Medicine

## 2018-11-03 DIAGNOSIS — Z8541 Personal history of malignant neoplasm of cervix uteri: Secondary | ICD-10-CM | POA: Insufficient documentation

## 2018-11-03 DIAGNOSIS — R319 Hematuria, unspecified: Secondary | ICD-10-CM | POA: Insufficient documentation

## 2018-11-03 DIAGNOSIS — Z886 Allergy status to analgesic agent status: Secondary | ICD-10-CM | POA: Insufficient documentation

## 2018-11-03 DIAGNOSIS — E785 Hyperlipidemia, unspecified: Secondary | ICD-10-CM | POA: Insufficient documentation

## 2018-11-03 DIAGNOSIS — I10 Essential (primary) hypertension: Secondary | ICD-10-CM | POA: Insufficient documentation

## 2018-11-03 DIAGNOSIS — F17210 Nicotine dependence, cigarettes, uncomplicated: Secondary | ICD-10-CM | POA: Insufficient documentation

## 2018-11-03 DIAGNOSIS — R1032 Left lower quadrant pain: Secondary | ICD-10-CM | POA: Insufficient documentation

## 2018-11-03 DIAGNOSIS — Z7984 Long term (current) use of oral hypoglycemic drugs: Secondary | ICD-10-CM | POA: Insufficient documentation

## 2018-11-03 DIAGNOSIS — R7303 Prediabetes: Secondary | ICD-10-CM | POA: Insufficient documentation

## 2018-11-03 DIAGNOSIS — B182 Chronic viral hepatitis C: Secondary | ICD-10-CM | POA: Insufficient documentation

## 2018-11-03 DIAGNOSIS — R109 Unspecified abdominal pain: Secondary | ICD-10-CM

## 2018-11-03 DIAGNOSIS — N939 Abnormal uterine and vaginal bleeding, unspecified: Secondary | ICD-10-CM | POA: Insufficient documentation

## 2018-11-03 DIAGNOSIS — Z79899 Other long term (current) drug therapy: Secondary | ICD-10-CM | POA: Insufficient documentation

## 2018-11-03 LAB — HEPATIC FUNCTION PANEL
ALT: 25 U/L (ref 0–44)
AST: 23 U/L (ref 15–41)
Albumin: 4.3 g/dL (ref 3.5–5.0)
Alkaline Phosphatase: 135 U/L — ABNORMAL HIGH (ref 38–126)
Bilirubin, Direct: <0.1 mg/dL (ref 0.0–0.2)
Total Bilirubin: 0.6 mg/dL (ref 0.3–1.2)
Total Protein: 7.7 g/dL (ref 6.5–8.1)

## 2018-11-03 LAB — BASIC METABOLIC PANEL
Anion gap: 10 (ref 5–15)
BUN: 11 mg/dL (ref 6–20)
CO2: 21 mmol/L — ABNORMAL LOW (ref 22–32)
Calcium: 9.1 mg/dL (ref 8.9–10.3)
Chloride: 106 mmol/L (ref 98–111)
Creatinine, Ser: 0.99 mg/dL (ref 0.44–1.00)
GFR calc Af Amer: >60 mL/min (ref >60)
GFR calc non Af Amer: >60 mL/min (ref >60)
Glucose, Bld: 106 mg/dL — ABNORMAL HIGH (ref 70–99)
Potassium: 3.3 mmol/L — ABNORMAL LOW (ref 3.5–5.1)
Sodium: 137 mmol/L (ref 135–145)

## 2018-11-03 LAB — I-STAT BETA HCG BLOOD, ED (MC, WL, AP ONLY): I-stat hCG, quantitative: <5 m[IU]/mL (ref <5)

## 2018-11-03 LAB — URINALYSIS, ROUTINE W REFLEX MICROSCOPIC
Bacteria, UA: NONE SEEN
Bilirubin Urine: NEGATIVE
Glucose, UA: NEGATIVE mg/dL
Ketones, ur: NEGATIVE mg/dL
Leukocytes,Ua: NEGATIVE
Nitrite: NEGATIVE
Protein, ur: NEGATIVE mg/dL
Specific Gravity, Urine: 1.004 — ABNORMAL LOW (ref 1.005–1.030)
pH: 6 (ref 5.0–8.0)

## 2018-11-03 LAB — CBC
HCT: 43.5 % (ref 36.0–46.0)
Hemoglobin: 13.7 g/dL (ref 12.0–15.0)
MCH: 27 pg (ref 26.0–34.0)
MCHC: 31.5 g/dL (ref 30.0–36.0)
MCV: 85.6 fL (ref 80.0–100.0)
Platelets: 307 10*3/uL (ref 150–400)
RBC: 5.08 MIL/uL (ref 3.87–5.11)
RDW: 14.2 % (ref 11.5–15.5)
WBC: 10 10*3/uL (ref 4.0–10.5)
nRBC: 0 % (ref 0.0–0.2)

## 2018-11-03 MED ORDER — AMLODIPINE BESYLATE 5 MG PO TABS
10.0000 mg | ORAL_TABLET | Freq: Once | ORAL | Status: AC
  Administered 2018-11-03: 10 mg via ORAL
  Filled 2018-11-03: qty 2

## 2018-11-03 MED ORDER — LISINOPRIL 10 MG PO TABS
5.0000 mg | ORAL_TABLET | Freq: Once | ORAL | Status: AC
  Administered 2018-11-03: 08:00:00 5 mg via ORAL
  Filled 2018-11-03: qty 1

## 2018-11-03 NOTE — Discharge Instructions (Addendum)
We have discussed the results of your CT renal study and pelvic ultrasound.  I have provided a copy of both studies for your records.  Please schedule an appointment with Dr. Wynetta Emery, he will ultimately need gynecology referral for follow-up.

## 2018-11-03 NOTE — ED Provider Notes (Addendum)
[facility]GENCY DEPARTMENT Provider Note   CSN: [id number] Arrival date & time: 11/03/18  [id number]     History   Chief Complaint No chief complaint on file.   HPI Felicia Hendricks is a 43 y.o. female.     43 y.o female with an extensive PMH of HTN,Hep C, Cervical CA, Polycystic kidney disease (only has left kidney) presents to the ED with two complaints. First complaint of vaginal bleeding, which first began this morning, states there was bright red blood with small clots. She reports last time she had a menstrual cycle was in ~2013. She also reports a history of IV drug abuse which stopped the same year her period resumed. She also endorses a previous history of cervical cancer, reports she had a large procedure in which she had cancer removed from her cervix.  Patient is also endorsing some hematuria, states this is been going on for a few weeks, Dr. Wynetta Emery her PCP has been following her for this.  She also endorses some left flank pain, states this feels like a small discomfort radiating from her left flank onto the left upper quadrant.  She attempted to obtain referral for infectious disease physician but due to financial reasons she has been unable to do so.  She denies any abdominal pain, chest pain, shortness of breath, fevers.  Of note, patient's last menstrual cycle was in 2013.  In addition, she has stopped IV drug use since 2013.  Her last Pap smear was 2012.  She does have a history of high blood pressure, reports she has not been taking her medication for the past 3 days.  The history is provided by the patient.    Past Medical History:  Diagnosis Date   Cervical cancer ([place])    Chronic back pain    Complication of anesthesia    EPIDERALS DO NOT WORK ON ME "   GERD (gastroesophageal reflux disease)    Hepatitis C    Polycystic kidney disease    Substance abuse [facility])     Patient Active Problem List   Diagnosis Date Noted   Prediabetes  10/11/2018   Reactive depression 06/18/2018   Stressful life event affecting family 06/18/2018   Leukocytosis 12/08/2016   Hyperlipidemia 12/08/2016   Hep C w/o coma, chronic ([place]) 11/10/2016   Hx of opioid abuse ([place]) 11/10/2016   Insomnia 11/10/2016   Polycystic kidney 11/10/2016   Tobacco abuse 07/24/2014   GERD (gastroesophageal reflux disease) 07/24/2014   Essential hypertension    Chronic back pain     Past Surgical History:  Procedure Laterality Date   CESAREAN SECTION     NEPHRECTOMY       OB History   No obstetric history on file.      Home Medications    Prior to Admission medications   Medication Sig Start Date End Date Taking? Authorizing Provider  amLODipine (NORVASC) 10 MG tablet Take 1 tablet (10 mg total) by mouth daily. 10/07/18   Ladell Pier, MD  atorvastatin (LIPITOR) 20 MG tablet Take 1 tablet (20 mg total) by mouth daily. 10/08/18   Ladell Pier, MD  lisinopril (ZESTRIL) 5 MG tablet Take 1 tablet (5 mg total) by mouth daily. 10/07/18   Ladell Pier, MD  loperamide (IMODIUM A-D) 2 MG tablet Take 1 tablet (2 mg total) by mouth 3 (three) times daily as needed for diarrhea or loose stools. 04/02/18   Ladell Pier, MD  loratadine (CLARITIN) 10  MG tablet Take 1 tablet (10 mg total) by mouth daily. 04/12/18   Ladell Pier, MD  metFORMIN (GLUCOPHAGE) 500 MG tablet Take 0.5 tablets (250 mg total) by mouth daily with breakfast. 10/11/18   Ladell Pier, MD  METHADONE HCL PO Take 1 tablet by mouth daily. 145 mg    [provider]  sertraline (ZOLOFT) 100 MG tablet Take 1 tablet (100 mg total) by mouth daily. 10/07/18   Ladell Pier, MD    Family History Family History  Problem Relation Age of Onset   Heart disease Mother    Diabetes Father     Social History Social History   Tobacco Use   Smoking status: Current Every Day Smoker    Packs/day: 1.00    Years: 25.00    Pack years: 25.00     Types: Cigarettes   Smokeless tobacco: Never Used   Tobacco comment: currently   Substance Use Topics   Alcohol use: Not Currently    Comment: social drinker   Drug use: Yes    Types: IV    Comment: denies current use      Allergies   Aspirin   Review of Systems Review of Systems  Constitutional: Negative for chills and fever.  HENT: Negative for ear pain and sore throat.   Eyes: Negative for pain and visual disturbance.  Respiratory: Negative for cough and shortness of breath.   Cardiovascular: Negative for chest pain and palpitations.  Gastrointestinal: Negative for abdominal pain and vomiting.  Genitourinary: Positive for flank pain, hematuria and vaginal bleeding. Negative for dysuria.  Musculoskeletal: Negative for arthralgias and back pain.  Skin: Negative for color change and rash.  Neurological: Negative for seizures and syncope.  All other systems reviewed and are negative.    Physical Exam Updated Vital Signs BP 129/78    Pulse (!) 46    Temp 97.9 F (36.6 C) (Oral)    Resp 16    SpO2 97%   Physical Exam Vitals signs and nursing note reviewed.  Constitutional:      General: She is not in acute distress.    Appearance: Normal appearance. She is well-developed.  HENT:     Head: Normocephalic and atraumatic.     Mouth/Throat:     Pharynx: No oropharyngeal exudate.  Eyes:     Pupils: Pupils are equal, round, and reactive to light.  Neck:     Musculoskeletal: Normal range of motion.  Cardiovascular:     Rate and Rhythm: Normal rate and regular rhythm.     Heart sounds: Normal heart sounds.  Pulmonary:     Effort: Pulmonary effort is normal. No respiratory distress.     Breath sounds: Normal breath sounds.     Comments: Lungs are clear to auscultation.  No wheezing, rhonchi, rales. Abdominal:     General: Bowel sounds are normal. There is no distension.     Palpations: Abdomen is soft.     Tenderness: There is no abdominal tenderness. There is left  CVA tenderness.     Comments: Abdomen appears soft, mild tenderness on the left upper quadrant.  Musculoskeletal:        General: No tenderness or deformity.     Right lower leg: No edema.     Left lower leg: No edema.  Skin:    General: Skin is warm and dry.  Neurological:     Mental Status: She is alert and oriented to person, place, and time.  Comments: No facial asymmetry, dysarthria, neurologically intact.      ED Treatments / Results  Labs (all labs ordered are listed, but only abnormal results are displayed) Labs Reviewed  BASIC METABOLIC PANEL - Abnormal; Notable for the following components:      Result Value   Potassium 3.3 (*)    CO2 21 (*)    Glucose, Bld 106 (*)    All other components within normal limits  URINALYSIS, ROUTINE W REFLEX MICROSCOPIC - Abnormal; Notable for the following components:   Color, Urine STRAW (*)    Specific Gravity, Urine 1.004 (*)    Hgb urine dipstick LARGE (*)    All other components within normal limits  HEPATIC FUNCTION PANEL - Abnormal; Notable for the following components:   Alkaline Phosphatase 135 (*)    All other components within normal limits  CBC  I-STAT BETA HCG BLOOD, ED ([facility], [facility], [facility] ONLY)    EKG None  Radiology US Transvaginal Non-ob  Result Date: 11/03/2018 CLINICAL DATA:  Patient with vaginal bleeding. EXAM: TRANSABDOMINAL AND TRANSVAGINAL ULTRASOUND OF PELVIS TECHNIQUE: Both transabdominal and transvaginal ultrasound examinations of the pelvis were performed. Transabdominal technique was performed for global imaging of the pelvis including uterus, ovaries, adnexal regions, and pelvic cul-de-sac. It was necessary to proceed with endovaginal exam following the transabdominal exam to visualize the endometrium. COMPARISON:  None FINDINGS: Uterus Measurements: 7.7 x 3.6 x 4.4 cm = volume: 64.8 mL. No fibroids or other mass visualized. Endometrium Thickness: 7 mm.  No focal abnormality visualized. Right ovary  Measurements: 2.7 x 1.1 x 1.6 cm = volume: 2.4 mL. Normal appearance/no adnexal mass. Left ovary Not visualized. Other findings No abnormal free fluid. IMPRESSION: Endometrium measures 7 mm. In the setting of post-menopausal bleeding, endometrial sampling is indicated to exclude carcinoma. If results are benign, sonohysterogram should be considered for focal lesion work-up. (Ref: Radiological Reasoning: Algorithmic Workup of Abnormal Vaginal Bleeding with Endovaginal Sonography and Sonohysterography. AJR [id number]) Electronically Signed   By: Lovey Newcomer M.D.   On: 11/03/2018 10:51   US Pelvis Complete  Result Date: 11/03/2018 CLINICAL DATA:  Patient with vaginal bleeding. EXAM: TRANSABDOMINAL AND TRANSVAGINAL ULTRASOUND OF PELVIS TECHNIQUE: Both transabdominal and transvaginal ultrasound examinations of the pelvis were performed. Transabdominal technique was performed for global imaging of the pelvis including uterus, ovaries, adnexal regions, and pelvic cul-de-sac. It was necessary to proceed with endovaginal exam following the transabdominal exam to visualize the endometrium. COMPARISON:  None FINDINGS: Uterus Measurements: 7.7 x 3.6 x 4.4 cm = volume: 64.8 mL. No fibroids or other mass visualized. Endometrium Thickness: 7 mm.  No focal abnormality visualized. Right ovary Measurements: 2.7 x 1.1 x 1.6 cm = volume: 2.4 mL. Normal appearance/no adnexal mass. Left ovary Not visualized. Other findings No abnormal free fluid. IMPRESSION: Endometrium measures 7 mm. In the setting of post-menopausal bleeding, endometrial sampling is indicated to exclude carcinoma. If results are benign, sonohysterogram should be considered for focal lesion work-up. (Ref: Radiological Reasoning: Algorithmic Workup of Abnormal Vaginal Bleeding with Endovaginal Sonography and Sonohysterography. [id number] [id number]) Electronically Signed   By: Lovey Newcomer M.D.   On: 11/03/2018 10:51   Ct Renal Stone Study  Result Date:  11/03/2018 CLINICAL DATA:  Left flank pain since yesterday. Pt also having vaginal bleeding. Pt states she has not had a period in 8 years. Pt on has the left kidney. EXAM: CT ABDOMEN AND PELVIS WITHOUT CONTRAST TECHNIQUE: Multidetector CT imaging of the abdomen and pelvis was performed following  the standard protocol without IV contrast. COMPARISON:  None. FINDINGS: Lower chest: No acute abnormality. Hepatobiliary: No focal liver abnormality is seen. No biliary duct dilatation. Note is made of small calcified gallstones. No pericholecystic fluid. Pancreas: Unremarkable. No pancreatic ductal dilatation or surrounding inflammatory changes. Spleen: Normal in size without focal abnormality. Adrenals/Urinary Tract: Normal adrenal glands. RIGHT nephrectomy. LEFT kidney is unremarkable. LEFT ureter is unremarkable. The bladder and visualized portion of the urethra are normal. Stomach/Bowel: Stomach and small bowel loops are normal in appearance. The appendix is well seen and has a normal appearance. Loops of colon contain significant stool burden, primarily within the ascending and transverse segments. No bowel obstruction. The no significant diverticula. Vascular/Lymphatic: There is atherosclerotic calcification of the abdominal aorta, not associated with aneurysm. No retroperitoneal or mesenteric adenopathy. Reproductive: The uterus is present. No adnexal mass. Other: No abdominal wall hernia or abnormality. No abdominopelvic ascites. Musculoskeletal: Stable compression fracture of L1. IMPRESSION: 1. No evidence for acute abnormality of the abdomen or pelvis. 2. RIGHT nephrectomy. 3. Significant stool burden. 4. Normal appendix. 5. Stable L1 compression fracture. 6. Aortic Atherosclerosis (ICD10-I70.0). Electronically Signed   By: Nolon Nations M.D.   On: 11/03/2018 10:12    Procedures Procedures (including critical care time)  Medications Ordered in ED Medications  amLODipine (NORVASC) tablet 10 mg (10 mg  Oral Given 11/03/18 0820)  lisinopril (ZESTRIL) tablet 5 mg (5 mg Oral Given 11/03/18 [id number])     Initial Impression / Assessment and Plan / ED Course  I have reviewed the triage vital signs and the nursing notes.  Pertinent labs & imaging results that were available during my care of the patient were reviewed by me and considered in my medical decision making (see chart for details).       Patient with extensive past medical history including polycystic kidney disease, hepatitis C, cervical cancer status post fluids procedure presents to the ED with complaints of vaginal bleeding, hematuria.  According to her chart which I have extensively review, patient does have a prior history of cervical cancer, had a procedure for removal.  She also endorses hematuria for a few weeks currently followed by Dr. Wynetta Emery.  Patient does have a history of hypertension at baseline, arrived in the ED hypertensive with blood pressure 185/152, states she has not been taking her blood pressure medication in 3 days.  I have ordered her Norvasc along with lisinopril to help with her pressure, will obtain screening labs along with further evaluation.  Of note, patient's son would like to be informed on patient's condition, she has given permission to do so cell phone number  [phone].   CBC showed no leukocytosis, hemoglobin is unremarkable despite vaginal bleeding.  BMP showed slight hypokalemia, no other electrolyte derangement.  Creatinine level is unremarkable and improved from her previous visits.  UA was remarkable for large hemoglobin, no nitrates, leukocytes white blood cell count is 0-5, patient does report chronic hematuria at baseline and currently followed by Dr. Wynetta Emery.  hCG was negative.  Will add on hepatic function panel to further evaluate her LFTs as patient does have previous history of hep C.   CT renal showed: 1. No evidence for acute abnormality of the abdomen or pelvis.  2. RIGHT nephrectomy.    3. Significant stool burden.  4. Normal appendix.  5. Stable L1 compression fracture.  6. Aortic Atherosclerosis (ICD10-I70.0).   US pelvis showed: Endometrium measures 7 mm. In the setting of post-menopausal  bleeding, endometrial sampling is indicated  to exclude carcinoma. If  results are benign, sonohysterogram should be considered for focal  lesion work-up. (Ref: Radiological Reasoning: Algorithmic Workup of  Abnormal Vaginal Bleeding with Endovaginal Sonography and  Sonohysterography. [id number] [id number])     These results were discussed with patient at length, creatinine level remains normal, advised to take Tylenol for her symptoms instead.  LFTs are within normal limits, she is waiting on referral for infectious disease specialist to be a Dr. Wynetta Emery.  Advised to follow-up with Dr. Wynetta Emery and have referral for gynecology to further evaluate her vaginal bleeding.  Blood pressure has improved since receiving medication currently normotensive.  Patient with otherwise stable results in stable condition with chronic complaints stable for discharge.  Return precautions discussed at length.  Portions of this note were generated with [employer]. Dictation errors may occur despite best attempts at proofreading.  Final Clinical Impressions(s) / ED Diagnoses   Final diagnoses:  Vaginal bleeding  Left flank pain    ED Discharge Orders    None       Janeece Fitting, PA-C 11/03/18 [address], PA-C 11/03/18 1105    Lajean Saver, MD 11/03/18 [phone]

## 2018-11-03 NOTE — ED Triage Notes (Signed)
Pt from home c/o left flank pain and bleeding vaginal.  Left Flank pain. Pt only has 1 kidney (left side). Pt denies urinary frequency/burning urination.   Vaginal bleeding started 6 pm yesterday and has been ongoing for the past 12 hours. Pt states 1 tampon use since then. Pt states no period for past 8 years and was startled when she noticed bleeding. Pt denies birth control. Pt denies hysterectomy. Pt states not sexually active since 2012. Pt unsure if postmenopause. Last pelvic last April 2019, normal.   Pt believes period onset could correlate with decrease of methadone. Pt states history of drug abuse, ending in 2012. Denies recent use.   Pt.  BP 200/106 (133). Pt has not taken BP medication for past 3 days.    Pt took 4 Advil at midnight with pain relief.

## 2018-11-12 ENCOUNTER — Telehealth: Payer: Self-pay | Admitting: Internal Medicine

## 2018-11-12 NOTE — Telephone Encounter (Signed)
New Message   Pt is calling states she is having nausea and vomiting and would like to know if she could get her Zofran filled at [facility]. Please f/u

## 2018-11-13 ENCOUNTER — Other Ambulatory Visit: Payer: Self-pay | Admitting: Internal Medicine

## 2018-11-13 DIAGNOSIS — E785 Hyperlipidemia, unspecified: Secondary | ICD-10-CM

## 2018-11-13 MED FILL — metFORMIN HCL 500 MG TABS: 500 | 30 days supply | Qty: 15 | Fill #0

## 2018-11-13 MED FILL — AMLODIPINE BESYLATE 10 MG T: 10 | 30 days supply | Qty: 30 | Fill #0

## 2018-11-13 NOTE — Telephone Encounter (Signed)
Will forward to pcp

## 2018-11-14 MED FILL — SERTRALINE HCL 100 MG TAB: 100 | 30 days supply | Qty: 30 | Fill #1

## 2018-11-14 MED FILL — ATORVASTATIN 20 MG TABLET: 20 | 30 days supply | Qty: 30 | Fill #0

## 2018-11-15 ENCOUNTER — Other Ambulatory Visit: Payer: Self-pay | Admitting: Internal Medicine

## 2018-12-24 MED FILL — ATORVASTATIN CALCIUM 20 MG: 20 | 30 days supply | Qty: 30 | Fill #1

## 2018-12-24 MED FILL — SERTRALINE HCL 100 MG TAB: 100 | 30 days supply | Qty: 30 | Fill #2

## 2019-01-07 ENCOUNTER — Ambulatory Visit: Payer: Self-pay | Admitting: Internal Medicine

## 2019-02-05 ENCOUNTER — Other Ambulatory Visit: Payer: Self-pay | Admitting: Internal Medicine

## 2019-02-05 DIAGNOSIS — F329 Major depressive disorder, single episode, unspecified: Secondary | ICD-10-CM

## 2019-02-05 MED FILL — SERTRALINE HCL 100 MG TAB: 100 | 30 days supply | Qty: 30 | Fill #0

## 2019-02-05 MED FILL — ATORVASTATIN CALCIUM 20 MG: 20 | 30 days supply | Qty: 30 | Fill #2

## 2019-04-09 ENCOUNTER — Other Ambulatory Visit (HOSPITAL_COMMUNITY): Payer: Self-pay

## 2019-04-09 ENCOUNTER — Ambulatory Visit (HOSPITAL_COMMUNITY): Payer: Self-pay

## 2019-04-09 DIAGNOSIS — Z79899 Other long term (current) drug therapy: Secondary | ICD-10-CM

## 2019-04-10 ENCOUNTER — Other Ambulatory Visit (HOSPITAL_COMMUNITY): Payer: Self-pay

## 2019-04-15 ENCOUNTER — Other Ambulatory Visit: Payer: Self-pay

## 2019-04-15 ENCOUNTER — Ambulatory Visit (HOSPITAL_COMMUNITY)
Admission: RE | Admit: 2019-04-15 | Discharge: 2019-04-15 | Disposition: A | Discharge status: Home / Self Care | Payer: Self-pay | Source: Ambulatory Visit | Attending: Internal Medicine | Admitting: Internal Medicine

## 2019-04-15 DIAGNOSIS — R9431 Abnormal electrocardiogram [ECG] [EKG]: Secondary | ICD-10-CM | POA: Insufficient documentation

## 2019-04-15 DIAGNOSIS — Z79899 Other long term (current) drug therapy: Secondary | ICD-10-CM | POA: Insufficient documentation

## 2019-06-30 ENCOUNTER — Ambulatory Visit: Payer: Self-pay | Admitting: Internal Medicine

## 2019-10-21 ENCOUNTER — Other Ambulatory Visit: Payer: Self-pay

## 2019-10-21 ENCOUNTER — Ambulatory Visit: Payer: Self-pay | Admitting: Physician Assistant

## 2019-10-21 ENCOUNTER — Emergency Department (HOSPITAL_COMMUNITY): Payer: Self-pay

## 2019-10-21 ENCOUNTER — Ambulatory Visit: Payer: Self-pay | Admitting: *Deleted

## 2019-10-21 ENCOUNTER — Emergency Department (HOSPITAL_COMMUNITY)
Admission: EM | Admit: 2019-10-21 | Discharge: 2019-10-21 | Disposition: A | Discharge status: Home / Self Care | Payer: Self-pay | Attending: Emergency Medicine | Admitting: Emergency Medicine

## 2019-10-21 ENCOUNTER — Encounter (HOSPITAL_COMMUNITY): Payer: Self-pay | Admitting: Emergency Medicine

## 2019-10-21 VITALS — BP 136/91 | HR 78 | Temp 101.6°F | Resp 20

## 2019-10-21 DIAGNOSIS — G4700 Insomnia, unspecified: Secondary | ICD-10-CM

## 2019-10-21 DIAGNOSIS — R072 Precordial pain: Secondary | ICD-10-CM | POA: Insufficient documentation

## 2019-10-21 DIAGNOSIS — R197 Diarrhea, unspecified: Secondary | ICD-10-CM

## 2019-10-21 DIAGNOSIS — F411 Generalized anxiety disorder: Secondary | ICD-10-CM

## 2019-10-21 DIAGNOSIS — R509 Fever, unspecified: Secondary | ICD-10-CM

## 2019-10-21 DIAGNOSIS — I208 Other forms of angina pectoris: Secondary | ICD-10-CM

## 2019-10-21 DIAGNOSIS — R1013 Epigastric pain: Secondary | ICD-10-CM

## 2019-10-21 DIAGNOSIS — Z5321 Procedure and treatment not carried out due to patient leaving prior to being seen by health care provider: Secondary | ICD-10-CM | POA: Insufficient documentation

## 2019-10-21 LAB — CBC
HCT: 42.8 % (ref 36.0–46.0)
Hemoglobin: 13.6 g/dL (ref 12.0–15.0)
MCH: 27.4 pg (ref 26.0–34.0)
MCHC: 31.8 g/dL (ref 30.0–36.0)
MCV: 86.3 fL (ref 80.0–100.0)
Platelets: 334 10*3/uL (ref 150–400)
RBC: 4.96 MIL/uL (ref 3.87–5.11)
RDW: 14.6 % (ref 11.5–15.5)
WBC: 20.1 10*3/uL — ABNORMAL HIGH (ref 4.0–10.5)
nRBC: 0 % (ref 0.0–0.2)

## 2019-10-21 LAB — BASIC METABOLIC PANEL
Anion gap: 15 (ref 5–15)
BUN: 18 mg/dL (ref 6–20)
CO2: 20 mmol/L — ABNORMAL LOW (ref 22–32)
Calcium: 9.8 mg/dL (ref 8.9–10.3)
Chloride: 108 mmol/L (ref 98–111)
Creatinine, Ser: 2.17 mg/dL — ABNORMAL HIGH (ref 0.44–1.00)
GFR calc Af Amer: 31 mL/min — ABNORMAL LOW (ref >60)
GFR calc non Af Amer: 26 mL/min — ABNORMAL LOW (ref >60)
Glucose, Bld: 76 mg/dL (ref 70–99)
Potassium: 3.5 mmol/L (ref 3.5–5.1)
Sodium: 143 mmol/L (ref 135–145)

## 2019-10-21 LAB — I-STAT BETA HCG BLOOD, ED (MC, WL, AP ONLY): I-stat hCG, quantitative: <5 m[IU]/mL (ref <5)

## 2019-10-21 LAB — TROPONIN I (HIGH SENSITIVITY): Troponin I (High Sensitivity): 11 ng/L (ref <18)

## 2019-10-21 MED ORDER — TRAZODONE HCL 50 MG PO TABS: 25.0000 mg | ORAL_TABLET | Freq: Every evening | ORAL | 3 refills | Status: AC | PRN

## 2019-10-21 MED ORDER — FAMOTIDINE 20 MG PO TABS
20.0000 mg | ORAL_TABLET | Freq: Once | ORAL | Status: AC
  Administered 2019-10-21: 20 mg via ORAL

## 2019-10-21 NOTE — Telephone Encounter (Signed)
Patient's son is calling- he states they left the ED because of the wait. Mother had symptoms of heart attack. Son st.ates they were at ED- labs/ and other test were preformed- they left due to the wait. Son states his mother was in pain and crying and they did not assist her. Patient was at hospital- but left with son-due to wait. Trying to convince him to take her back- they want an appointment tomorrow- advised him he needs to take her back. Call to office for assistance from nurse- maybe she knows patient and can convince them to return.    Reason for Disposition  [1] Chest pain lasts > 5 minutes AND [2] age > 55  Answer Assessment - Initial Assessment Questions 1. LOCATION: "Where does it hurt?"       Pain mild now- aching now- breathing is better 2. RADIATION: "Does the pain go anywhere else?" (e.g., into neck, jaw, arms, back)     Minor radiation 3. ONSET: "When did the chest pain begin?" (Minutes, hours or days)      1 hour 4. PATTERN "Does the pain come and go, or has it been constant since it started?"  "Does it get worse with exertion?"      constant 5. DURATION: "How long does it last" (e.g., seconds, minutes, hours)     hours 6. SEVERITY: "How bad is the pain?"  (e.g., Scale 1-10; mild, moderate, or severe)    - MILD (1-3): doesn't interfere with normal activities     - MODERATE (4-7): interferes with normal activities or awakens from sleep    - SEVERE (8-10): excruciating pain, unable to do any normal activities       mild 7. CARDIAC RISK FACTORS: "Do you have any history of heart problems or risk factors for heart disease?" (e.g., angina, prior heart attack; diabetes, high blood pressure, high cholesterol, smoker, or strong family history of heart disease)     Yes- heart disease  8. PULMONARY RISK FACTORS: "Do you have any history of lung disease?"  (e.g., blood clots in lung, asthma, emphysema, birth control pills)      9. CAUSE: "What do you think is causing the chest  pain?"     Heat attack 10. OTHER SYMPTOMS: "Do you have any other symptoms?" (e.g., dizziness, nausea, vomiting, sweating, fever, difficulty breathing, cough)       breathing is better, nausea, pain in R arm 11. PREGNANCY: "Is there any chance you are pregnant?" "When was your last menstrual period?"       n/a  Protocols used: CHEST PAIN-A-AH

## 2019-10-21 NOTE — Progress Notes (Signed)
Established Patient Office Visit  Subjective:  Patient ID: Felicia Hendricks, female    DOB: 08/11/1975  Age: 44 y.o. MRN: [id number]  CC:  Chief Complaint  Patient presents with   Chest Pain    HPI Felicia Hendricks reports that she started having chest pain this morning, states that it feels like pressure, something heavy on her chest.  Denies n/v/SOB/heartburn/acid reflux. Denies cough,  States that she took one nitroglycerin and the pain did resolve temporarily, but came back shortly after, but does endorse it was not as intense  Reports that she has not felt feverish, but does endorse that she has had diarrhea for 2 days, stating it is better and has not had an episode yet today.  Denies sick contacts, is not Covid-19 vaccinated, Denies cough  Reports that she has been having elevated anxiety, states that she has not slept since Saturday, states she has tried over-the-counter sleep remedies and melatonin without relief.  Endorses racing thoughts when trying to sleep.    Past Medical History:  Diagnosis Date   Cervical cancer (HCC)    Chronic back pain    Complication of anesthesia    EPIDERALS DO NOT WORK ON ME "   GERD (gastroesophageal reflux disease)    Hepatitis C    Polycystic kidney disease    Substance abuse ([place])     Past Surgical History:  Procedure Laterality Date   CESAREAN SECTION     NEPHRECTOMY      Family History  Problem Relation Age of Onset   Heart disease Mother    Diabetes Father     Social History   Socioeconomic History   Marital status: Widowed    Spouse name: Not on file   Number of children: Not on file   Years of education: Not on file   Highest education level: Not on file  Occupational History   Not on file  Tobacco Use   Smoking status: Current Every Day Smoker    Packs/day: 1.00    Years: 25.00    Pack years: 25.00    Types: Cigarettes   Smokeless tobacco: Never Used   Tobacco comment: currently    Vaping Use   Vaping Use: Never used  Substance and Sexual Activity   Alcohol use: Not Currently    Comment: social drinker   Drug use: Yes    Types: IV    Comment: denies current use    Sexual activity: Not on file  Other Topics Concern   Not on file  Social History Narrative   Not on file   Social Determinants of Health   Financial Resource Strain:    Difficulty of Paying Living Expenses:   Food Insecurity:    Worried About Charity fundraiser in the Last Year:    Arboriculturist in the Last Year:   Transportation Needs:    Film/video editor (Medical):    Lack of Transportation (Non-Medical):   Physical Activity:    Days of Exercise per Week:    Minutes of Exercise per Session:   Stress:    Feeling of Stress :   Social Connections:    Frequency of Communication with Friends and Family:    Frequency of Social Gatherings with Friends and Family:    Attends Religious Services:    Active Member of Clubs or Organizations:    Attends Archivist Meetings:    Marital Status:   Intimate Partner Violence:  Fear of Current or Ex-Partner:    Emotionally Abused:    Physically Abused:    Sexually Abused:     Outpatient Medications Prior to Visit  Medication Sig Dispense Refill   amLODipine (NORVASC) 10 MG tablet Take 1 tablet (10 mg total) by mouth daily. 30 tablet 5   atorvastatin (LIPITOR) 20 MG tablet TAKE 1 TABLET (20 MG TOTAL) BY MOUTH DAILY. 30 tablet 2   loperamide (IMODIUM A-D) 2 MG tablet Take 1 tablet (2 mg total) by mouth 3 (three) times daily as needed for diarrhea or loose stools. 20 tablet 0   metFORMIN (GLUCOPHAGE) 500 MG tablet Take 0.5 tablets (250 mg total) by mouth daily with breakfast. 30 tablet 3   METHADONE HCL PO Take 1 tablet by mouth daily. 145 mg     sertraline (ZOLOFT) 100 MG tablet Take 1 tablet (100 mg total) by mouth daily. Must have office visit for refills. 30 tablet 0   lisinopril (ZESTRIL) 5 MG  tablet Take 1 tablet (5 mg total) by mouth daily. (Patient not taking: Reported on 10/21/2019) 30 tablet 5   loratadine (CLARITIN) 10 MG tablet Take 1 tablet (10 mg total) by mouth daily. (Patient not taking: Reported on 10/21/2019) 30 tablet 0   No facility-administered medications prior to visit.    Allergies  Allergen Reactions   Aspirin     Kidney disease     ROS Review of Systems  Constitutional: Positive for fatigue and fever. Negative for chills.  HENT: Negative for postnasal drip, sneezing and sore throat.   Eyes: Negative.   Respiratory: Positive for chest tightness. Negative for shortness of breath and wheezing.   Cardiovascular: Positive for chest pain. Negative for palpitations and leg swelling.  Gastrointestinal: Positive for diarrhea. Negative for abdominal pain, nausea and vomiting.  Endocrine: Negative.   Genitourinary: Negative.   Musculoskeletal: Negative.   Skin: Negative.   Allergic/Immunologic: Negative.   Neurological: Negative for dizziness, syncope, light-headedness and headaches.  Hematological: Negative.   Psychiatric/Behavioral: Positive for dysphoric mood and sleep disturbance. Negative for self-injury and suicidal ideas. The patient is nervous/anxious.       Objective:    Physical Exam Vitals and nursing note reviewed.  Constitutional:      General: She is not in acute distress.    Appearance: She is well-developed. She is not ill-appearing, toxic-appearing or diaphoretic.  HENT:     Head: Normocephalic and atraumatic.     Right Ear: External ear normal.     Left Ear: External ear normal.     Nose: Nose normal.     Mouth/Throat:     Mouth: Mucous membranes are moist.     Pharynx: Oropharynx is clear.  Eyes:     Extraocular Movements: Extraocular movements intact.     Pupils: Pupils are equal, round, and reactive to light.  Cardiovascular:     Rate and Rhythm: Normal rate and regular rhythm.     Pulses:          Carotid pulses are 2+ on  the right side and 2+ on the left side.      Radial pulses are 2+ on the right side and 2+ on the left side.       Dorsalis pedis pulses are 2+ on the right side and 2+ on the left side.       Posterior tibial pulses are 2+ on the right side and 2+ on the left side.     Heart sounds: Normal heart sounds.  Pulmonary:     Effort: Pulmonary effort is normal.     Breath sounds: Normal breath sounds.  Chest:     Chest wall: No tenderness.  Abdominal:     General: Bowel sounds are normal.     Palpations: Abdomen is soft.     Tenderness: There is abdominal tenderness in the epigastric area.  Musculoskeletal:        General: Normal range of motion.     Cervical back: Normal range of motion and neck supple.  Skin:    General: Skin is warm and dry.  Neurological:     General: No focal deficit present.     Mental Status: She is alert and oriented to person, place, and time.  Psychiatric:        Mood and Affect: Mood is anxious.        Behavior: Behavior normal.        Thought Content: Thought content normal.        Judgment: Judgment normal.     BP (!) 136/91 (BP Location: Left Arm, Patient Position: Sitting, Cuff Size: Normal)    Pulse 78    Temp (!) 101.6 F (38.7 C) (Oral)    Resp 20    SpO2 (!) 9%  Wt Readings from Last 3 Encounters:  10/21/19 185 lb (83.9 kg)  04/12/18 193 lb 3.2 oz (87.6 kg)  04/02/18 192 lb 12.8 oz (87.5 kg)     Health Maintenance Due  Topic Date Due   COVID-19 Vaccine (1) Never done   TETANUS/TDAP  Never done   INFLUENZA VACCINE  10/12/2019   PAP SMEAR-Modifier  12/09/2019    There are no preventive care reminders to display for this patient.  No results found for: TSH Lab Results  Component Value Date   WBC 20.1 (H) 10/21/2019   HGB 13.6 10/21/2019   HCT 42.8 10/21/2019   MCV 86.3 10/21/2019   PLT 334 10/21/2019   Lab Results  Component Value Date   NA 143 10/21/2019   K 3.5 10/21/2019   CO2 20 (L) 10/21/2019   GLUCOSE 76 10/21/2019    BUN 18 10/21/2019   CREATININE 2.17 (H) 10/21/2019   BILITOT 0.6 11/03/2018   ALKPHOS 135 (H) 11/03/2018   AST 23 11/03/2018   ALT 25 11/03/2018   PROT 7.7 11/03/2018   ALBUMIN 4.3 11/03/2018   CALCIUM 9.8 10/21/2019   ANIONGAP 15 10/21/2019   Lab Results  Component Value Date   CHOL 211 (H) 10/07/2018   Lab Results  Component Value Date   HDL 36 (L) 10/07/2018   Lab Results  Component Value Date   LDLCALC 107 (H) 10/07/2018   Lab Results  Component Value Date   TRIG 342 (H) 10/07/2018   Lab Results  Component Value Date   CHOLHDL 5.9 (H) 10/07/2018   Lab Results  Component Value Date   HGBA1C 6.1 (H) 10/07/2018      Assessment & Plan:   Problem List Items Addressed This Visit      Other   Insomnia   Relevant Medications   traZODone (DESYREL) 50 MG tablet    Other Visit Diagnoses    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[place])    -  Primary   Relevant Medications   traZODone (DESYREL) 50 MG tablet   Other Relevant Orders   EKG 12-Lead (Completed)   Abdominal pain, epigastric       Relevant Medications   famotidine (PEPCID) tablet 20 mg (Completed)   Other Relevant  Orders   H Pylori, IGM, IGG, IGA AB   Fever, unspecified       Relevant Orders   Novel Coronavirus, NAA ([facility])   Diarrhea, unspecified type       Relevant Orders   Novel Coronavirus, NAA ([facility])   GAD (generalized anxiety disorder)       Relevant Medications   traZODone (DESYREL) 50 MG tablet      Meds ordered this encounter  Medications   traZODone (DESYREL) 50 MG tablet    Sig: Take 0.5-1 tablets (25-50 mg total) by mouth at bedtime as needed for sleep.    Dispense:  30 tablet    Refill:  3    Order Specific Question:   Supervising Provider    Answer:   Asencion Noble E [[id number]]   famotidine (PEPCID) tablet 20 mg  1. 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HCC) EKG completed, no change from EKG 6 months ago.  Patient had presented earlier in the day to the emergency department but left due to long  wait.  Had negative troponin, chest xray WNL; CBC WNL; Kidney function decreased.  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   - EKG 12-Lead  2. Abdominal pain, epigastric Patient had epigastric tenderness on palpation, history of GERD, increased anxiety, lack of sleep.  Encouraged patient to begin Prilosec over-the-counter, follow GERD lifestyle modifications - H Pylori, IGM, IGG, IGA AB - famotidine (PEPCID) tablet 20 mg  3. Fever, unspecified Rapid Covid testing negative, encouraged self isolation  - Novel Coronavirus, NAA ([facility])  4. Diarrhea, unspecified type Currently resolved, encouraged to increase hydration - Novel Coronavirus, NAA ([facility])  5. GAD (generalized anxiety disorder) Patient states she is compliant to Zoloft  6. Insomnia, unspecified type Trial trazodone, patient education given on sleep hygiene - traZODone (DESYREL) 50 MG tablet; Take 0.5-1 tablets (25-50 mg total) by mouth at bedtime as needed for sleep.  Dispense: 30 tablet; Refill: 3   I have reviewed the patient's medical history (PMH, PSH, Social History, Family History, Medications, and allergies) , and have been updated if relevant. I spent 40 minutes reviewing chart and  face to face time with patient.   Patient reported having follow-up with Dr. Wynetta Emery on October 23, 2019, last office visit was 1 year prior.  No appointment seen in patient's record.  Encourage patient to follow-up with community health and wellness in the morning.  Gave patient option to return to mobile unit for follow-up as needed, red flags given for prompt evaluation at emergency department.  Follow-up: No follow-ups on file.    Loraine Grip Mayers, PA-C

## 2019-10-21 NOTE — ED Notes (Signed)
lwbs

## 2019-10-21 NOTE — ED Notes (Signed)
Felicia Hendricks (Daughter#[phone])called for a status on her mother.  Thank you

## 2019-10-21 NOTE — Telephone Encounter (Signed)
Will forward to pcp

## 2019-10-21 NOTE — Patient Instructions (Signed)
Please start Trazodone 50 mg for your insomnia.  Please start omeprazole 20 mg over the counter for your chest pain, follow a GERD friendly diet as described and make sure to stay well hydrated.  Please call [facility] in the morning, as your appointment will more than likely be changed to virtual due to today's fever  Kennieth Rad, PA-C Physician Assistant [place] [email or web address]     Food Choices for Gastroesophageal Reflux Disease, Adult When you have gastroesophageal reflux disease (GERD), the foods you eat and your eating habits are very important. Choosing the right foods can help ease the discomfort of GERD. Consider working with a diet and nutrition specialist (dietitian) to help you make healthy food choices. What general guidelines should I follow?  Eating plan  Choose healthy foods low in fat, such as fruits, vegetables, whole grains, low-fat dairy products, and lean meat, fish, and poultry.  Eat frequent, small meals instead of three large meals each day. Eat your meals slowly, in a relaxed setting. Avoid bending over or lying down until 2-3 hours after eating.  Limit high-fat foods such as fatty meats or fried foods.  Limit your intake of oils, butter, and shortening to less than 8 teaspoons each day.  Avoid the following: ? Foods that cause symptoms. These may be different for different people. Keep a food diary to keep track of foods that cause symptoms. ? Alcohol. ? Drinking large amounts of liquid with meals. ? Eating meals during the 2-3 hours before bed.  Cook foods using methods other than frying. This may include baking, grilling, or broiling. Lifestyle  Maintain a healthy weight. Ask your health care provider what weight is healthy for you. If you need to lose weight, work with your health care provider to do so safely.  Exercise for at least 30 minutes on 5 or more days  each week, or as told by your health care provider.  Avoid wearing clothes that fit tightly around your waist and chest.  Do not use any products that contain nicotine or tobacco, such as cigarettes and e-cigarettes. If you need help quitting, ask your health care provider.  Sleep with the head of your bed raised. Use a wedge under the mattress or blocks under the bed frame to raise the head of the bed. What foods are not recommended? The items listed may not be a complete list. Talk with your dietitian about what dietary choices are best for you. Grains Pastries or quick breads with added fat. [place] toast. Vegetables Deep fried vegetables. [place] fries. Any vegetables prepared with added fat. Any vegetables that cause symptoms. For some people this may include tomatoes and tomato products, chili peppers, onions and garlic, and horseradish. Fruits Any fruits prepared with added fat. Any fruits that cause symptoms. For some people this may include citrus fruits, such as oranges, grapefruit, pineapple, and lemons. Meats and other protein foods High-fat meats, such as fatty beef or pork, hot dogs, ribs, ham, sausage, salami and bacon. Fried meat or protein, including fried fish and fried chicken. Nuts and nut butters. Dairy Whole milk and chocolate milk. Sour cream. Cream. Ice cream. Cream cheese. Milk shakes. Beverages Coffee and tea, with or without caffeine. Carbonated beverages. Sodas. Energy drinks. Fruit juice made with acidic fruits (such as orange or grapefruit). Tomato juice. Alcoholic drinks. Fats and oils Butter. Margarine. Shortening. Ghee. Sweets and desserts Chocolate and cocoa. Donuts. Seasoning and other foods Pepper. Peppermint and spearmint. Any condiments,  herbs, or seasonings that cause symptoms. For some people, this may include curry, hot sauce, or vinegar-based salad dressings. Summary  When you have gastroesophageal reflux disease (GERD), food and lifestyle choices  are very important to help ease the discomfort of GERD.  Eat frequent, small meals instead of three large meals each day. Eat your meals slowly, in a relaxed setting. Avoid bending over or lying down until 2-3 hours after eating.  Limit high-fat foods such as fatty meat or fried foods. This information is not intended to replace advice given to you by your health care provider. Make sure you discuss any questions you have with your health care provider. Document Revised: 06/20/2018 Document Reviewed: 02/29/2016 Elsevier Patient Education  [place].   Insomnia Insomnia is a sleep disorder that makes it difficult to fall asleep or stay asleep. Insomnia can cause fatigue, low energy, difficulty concentrating, mood swings, and poor performance at work or school. There are three different ways to classify insomnia:  Difficulty falling asleep.  Difficulty staying asleep.  Waking up too early in the morning. Any type of insomnia can be long-term (chronic) or short-term (acute). Both are common. Short-term insomnia usually lasts for three months or less. Chronic insomnia occurs at least three times a week for longer than three months. What are the causes? Insomnia may be caused by another condition, situation, or substance, such as:  Anxiety.  Certain medicines.  Gastroesophageal reflux disease (GERD) or other gastrointestinal conditions.  Asthma or other breathing conditions.  Restless legs syndrome, sleep apnea, or other sleep disorders.  Chronic pain.  Menopause.  Stroke.  Abuse of alcohol, tobacco, or illegal drugs.  Mental health conditions, such as depression.  Caffeine.  Neurological disorders, such as Alzheimer's disease.  An overactive thyroid (hyperthyroidism). Sometimes, the cause of insomnia may not be known. What increases the risk? Risk factors for insomnia include:  Gender. Women are affected more often than men.  Age. Insomnia is more common as  you get older.  Stress.  Lack of exercise.  Irregular work schedule or working night shifts.  Traveling between different time zones.  Certain medical and mental health conditions. What are the signs or symptoms? If you have insomnia, the main symptom is having trouble falling asleep or having trouble staying asleep. This may lead to other symptoms, such as:  Feeling fatigued or having low energy.  Feeling nervous about going to sleep.  Not feeling rested in the morning.  Having trouble concentrating.  Feeling irritable, anxious, or depressed. How is this diagnosed? This condition may be diagnosed based on:  Your symptoms and medical history. Your health care provider may ask about: ? Your sleep habits. ? Any medical conditions you have. ? Your mental health.  A physical exam. How is this treated? Treatment for insomnia depends on the cause. Treatment may focus on treating an underlying condition that is causing insomnia. Treatment may also include:  Medicines to help you sleep.  Counseling or therapy.  Lifestyle adjustments to help you sleep better. Follow these instructions at home: Eating and drinking   Limit or avoid alcohol, caffeinated beverages, and cigarettes, especially close to bedtime. These can disrupt your sleep.  Do not eat a large meal or eat spicy foods right before bedtime. This can lead to digestive discomfort that can make it hard for you to sleep. Sleep habits   Keep a sleep diary to help you and your health care provider figure out what could be causing your insomnia. Write  down: ? When you sleep. ? When you wake up during the night. ? How well you sleep. ? How rested you feel the next day. ? Any side effects of medicines you are taking. ? What you eat and drink.  Make your bedroom a dark, comfortable place where it is easy to fall asleep. ? Put up shades or blackout curtains to block light from outside. ? Use a white noise machine to  block noise. ? Keep the temperature cool.  Limit screen use before bedtime. This includes: ? Watching TV. ? Using your smartphone, tablet, or computer.  Stick to a routine that includes going to bed and waking up at the same times every day and night. This can help you fall asleep faster. Consider making a quiet activity, such as reading, part of your nighttime routine.  Try to avoid taking naps during the day so that you sleep better at night.  Get out of bed if you are still awake after 15 minutes of trying to sleep. Keep the lights down, but try reading or doing a quiet activity. When you feel sleepy, go back to bed. General instructions  Take over-the-counter and prescription medicines only as told by your health care provider.  Exercise regularly, as told by your health care provider. Avoid exercise starting several hours before bedtime.  Use relaxation techniques to manage stress. Ask your health care provider to suggest some techniques that may work well for you. These may include: ? Breathing exercises. ? Routines to release muscle tension. ? Visualizing peaceful scenes.  Make sure that you drive carefully. Avoid driving if you feel very sleepy.  Keep all follow-up visits as told by your health care provider. This is important. Contact a health care provider if:  You are tired throughout the day.  You have trouble in your daily routine due to sleepiness.  You continue to have sleep problems, or your sleep problems get worse. Get help right away if:  You have serious thoughts about hurting yourself or someone else. If you ever feel like you may hurt yourself or others, or have thoughts about taking your own life, get help right away. You can go to your nearest emergency department or call:  Your local emergency services (911 in the [place]  A suicide crisis helpline, such as the [facility] at 816-111-5918. This is open 24 hours a  day. Summary  Insomnia is a sleep disorder that makes it difficult to fall asleep or stay asleep.  Insomnia can be long-term (chronic) or short-term (acute).  Treatment for insomnia depends on the cause. Treatment may focus on treating an underlying condition that is causing insomnia.  Keep a sleep diary to help you and your health care provider figure out what could be causing your insomnia. This information is not intended to replace advice given to you by your health care provider. Make sure you discuss any questions you have with your health care provider. Document Revised: 02/09/2017 Document Reviewed: 12/07/2016 Elsevier Patient Education  2020 [employer].

## 2019-10-21 NOTE — ED Triage Notes (Signed)
Pt arrives to ED with c/o of substernal cp that started a few hours ago, felt like someone standing on her chest, pt took one nitro tablet and did ease pain some. Pt states currently pain is mild.

## 2019-10-22 NOTE — Telephone Encounter (Signed)
Pt was seen by Cari on 10/21/19

## 2019-10-24 LAB — SARS-COV-2, NAA 2 DAY TAT

## 2019-10-24 LAB — SPECIMEN STATUS REPORT

## 2019-10-24 LAB — NOVEL CORONAVIRUS, NAA: SARS-CoV-2, NAA: NOT DETECTED

## 2019-10-28 LAB — H PYLORI, IGM, IGG, IGA AB
H pylori, IgM Abs: <9 units (ref 0.0–8.9)
H. pylori, IgA Abs: <9 units (ref 0.0–8.9)
H. pylori, IgG AbS: 1.94 Index Value — ABNORMAL HIGH (ref 0.00–0.79)

## 2019-10-30 ENCOUNTER — Telehealth: Payer: Self-pay | Admitting: *Deleted

## 2019-10-30 NOTE — Telephone Encounter (Signed)
-----   Message from Kennieth Rad, [place] sent at 10/28/2019  1:38 PM EDT ----- Please let patient know that her test for  h pylori stomach infection  was negative, it does show positive for antibodies which means she may have had this infection previously.  Please encourage patient to make appointment with PCP or return to mobile unit if symptoms have not resolved.

## 2019-10-30 NOTE — Telephone Encounter (Signed)
MA unable to reach patient x2 or LVM.

## 2019-11-05 ENCOUNTER — Telehealth: Payer: Self-pay | Admitting: Internal Medicine

## 2019-11-05 NOTE — Telephone Encounter (Signed)
Question or Comment:  How to find my results from a covid test I had done and sent off on 10/21/2019  Patient called but no answer. Left message to return call. If patient calls back please provide COVID results from 10/21/19 and ask if patient has additional concerns.

## 2020-01-05 IMAGING — US US PELVIS COMPLETE
1 series · 13 of 25 positions shown · non-contrast
Comparison: None

CLINICAL DATA: Patient with vaginal bleeding.



[Series 1: us pelvis complete · 13 of 46 slices shown]
[im 1/46]
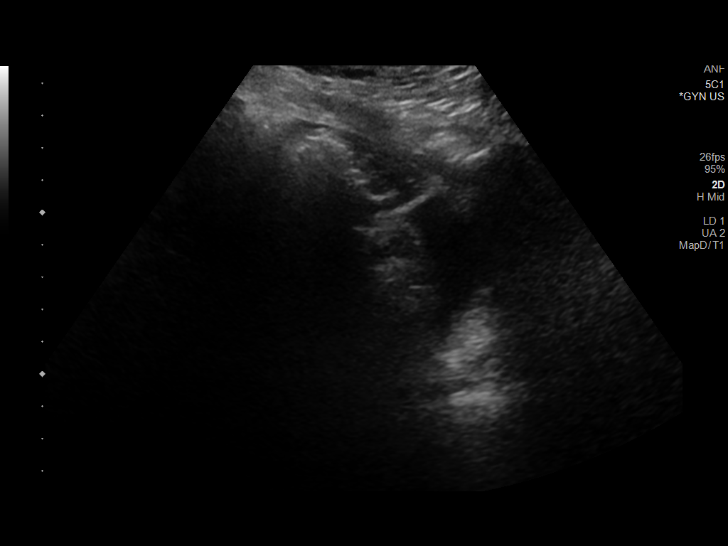
[im 4/46]
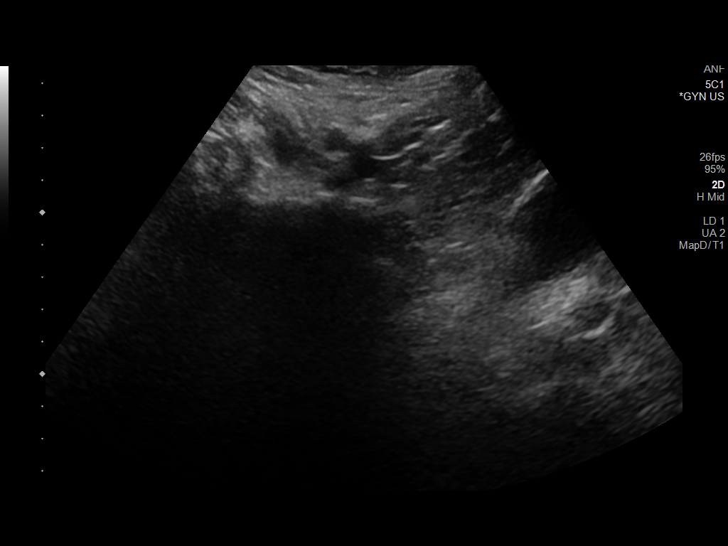
[im 8/46]
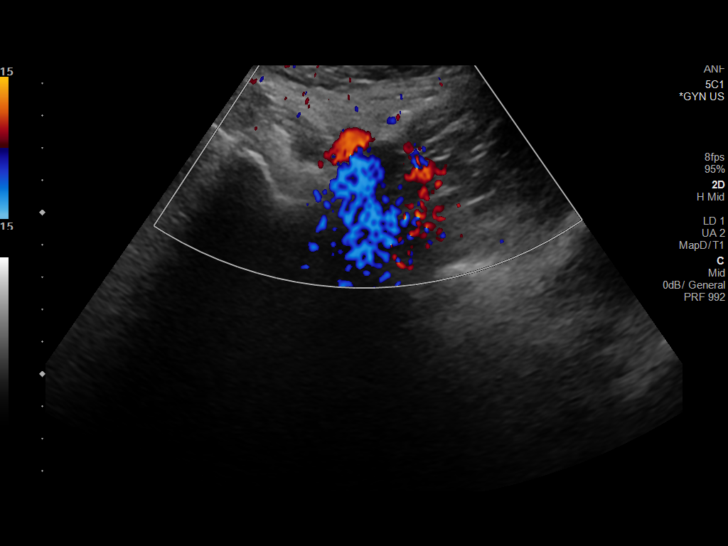
[im 12/46]
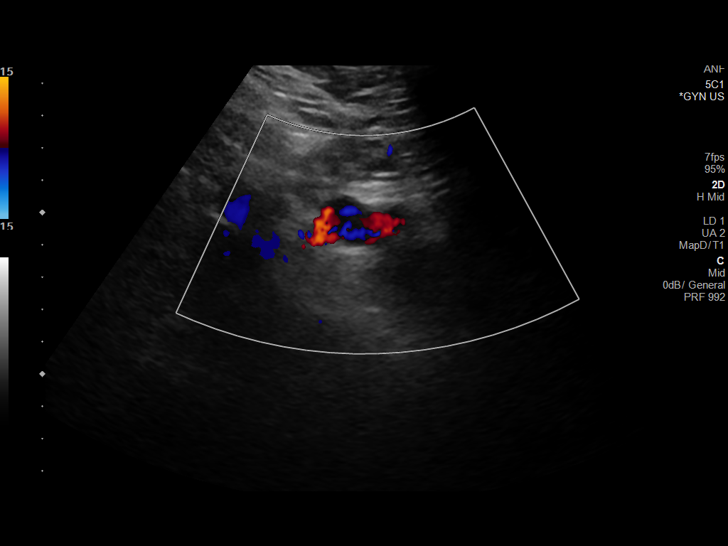
[im 16/46]
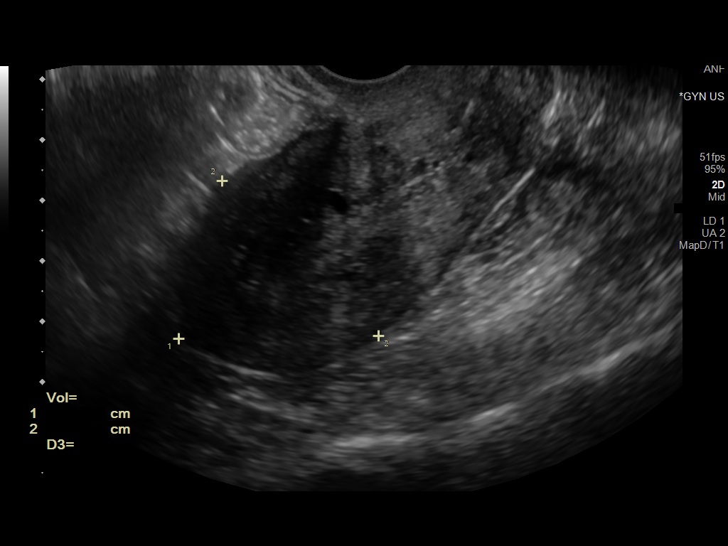
[im 19/46]
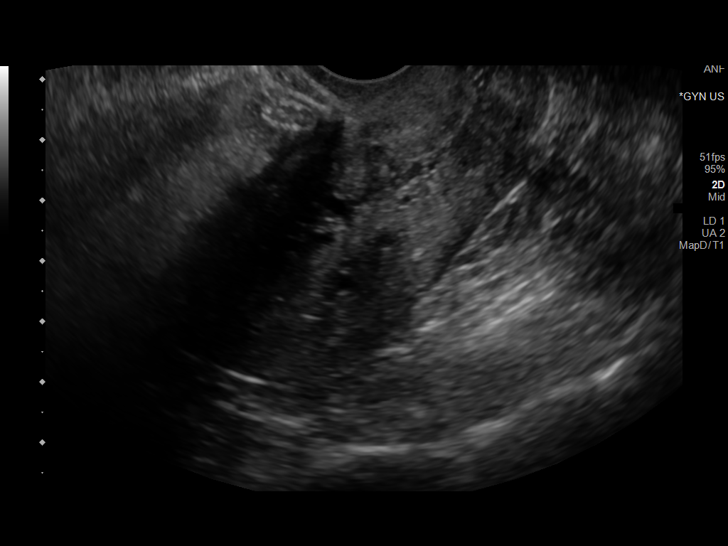
[im 23/46]
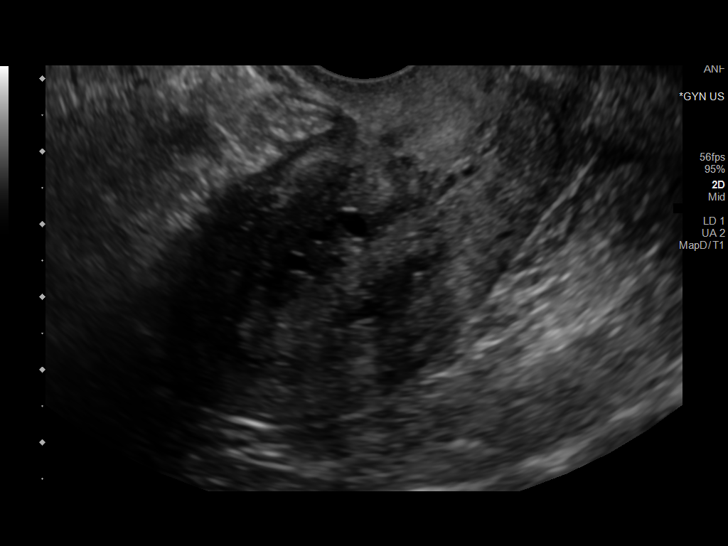
[im 27/46]
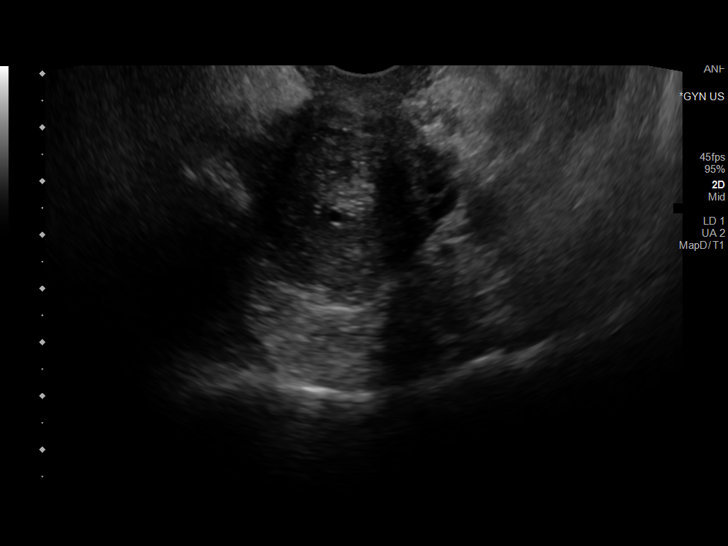
[im 31/46]
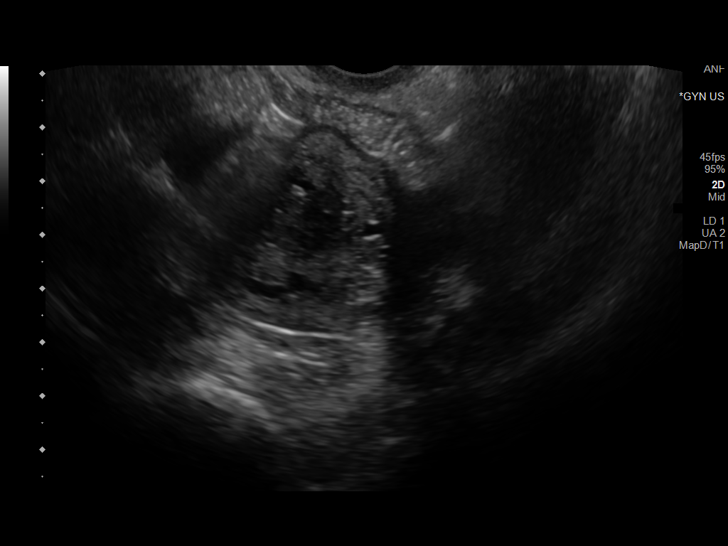
[im 34/46]
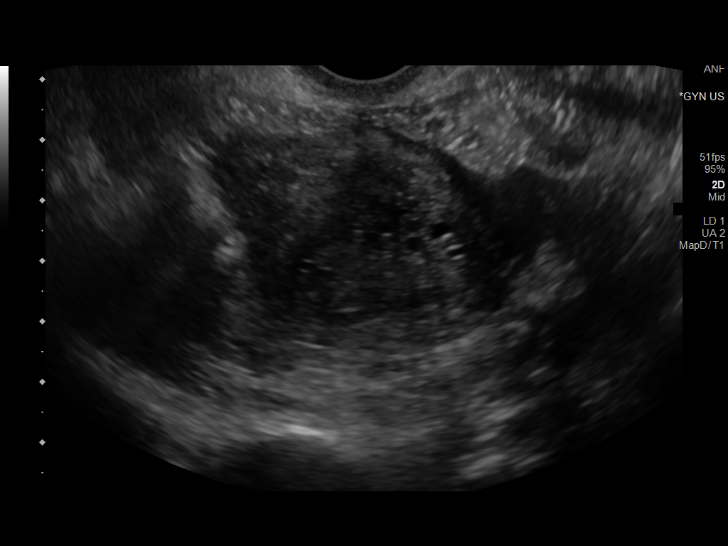
[im 38/46]
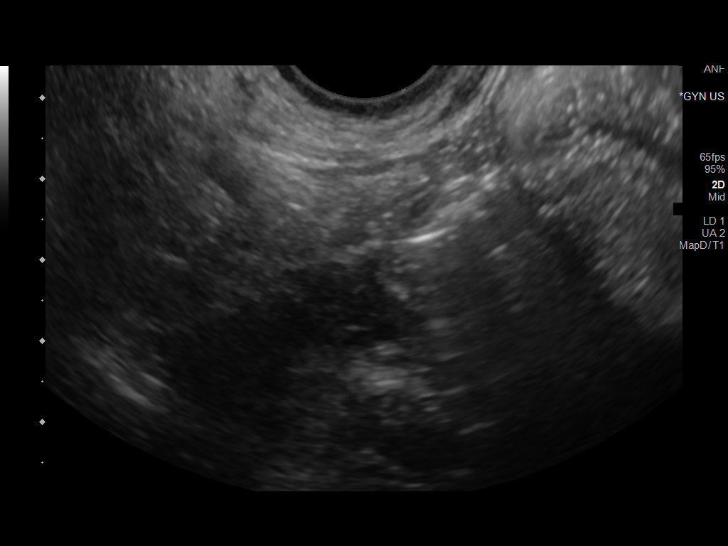
[im 42/46]
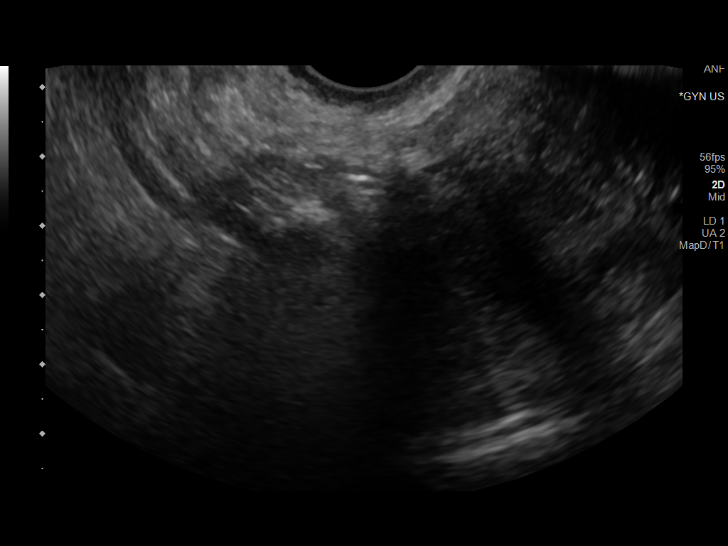
[im 46/46]
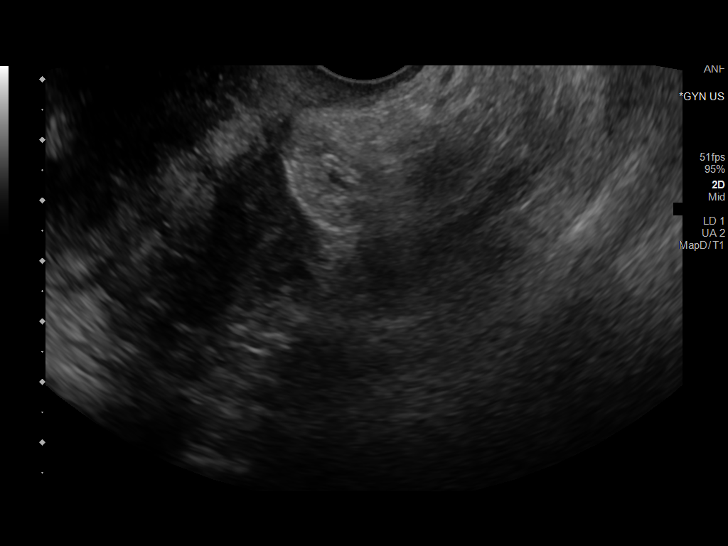

[13 of 25 positions shown; findings below may reference images not displayed]

FINDINGS: Uterus

Measurements: 7.7 x 3.6 x 4.4 cm = volume: 64.8 mL. No fibroids or
other mass visualized.

Endometrium

Thickness: 7 mm.  No focal abnormality visualized.

Right ovary

Measurements: 2.7 x 1.1 x 1.6 cm = volume: 2.4 mL. Normal
appearance/no adnexal mass.

Left ovary

Not visualized.

Other findings

No abnormal free fluid.
IMPRESSION: Endometrium measures 7 mm. In the setting of post-menopausal
bleeding, endometrial sampling is indicated to exclude carcinoma. If
results are benign, sonohysterogram should be considered for focal
lesion work-up. (Ref: Radiological Reasoning: Algorithmic Workup of
Abnormal Vaginal Bleeding with Endovaginal Sonography and
Sonohysterography. AJR 1887; 191:S68-73)

## 2020-12-22 IMAGING — DX DG CHEST 2V
2 series · 2 of 2 positions shown · non-contrast
Comparison: 07/24/2014

CLINICAL DATA: Chest pain started today

EXAM:
CHEST - 2 VIEW

[chest pa]
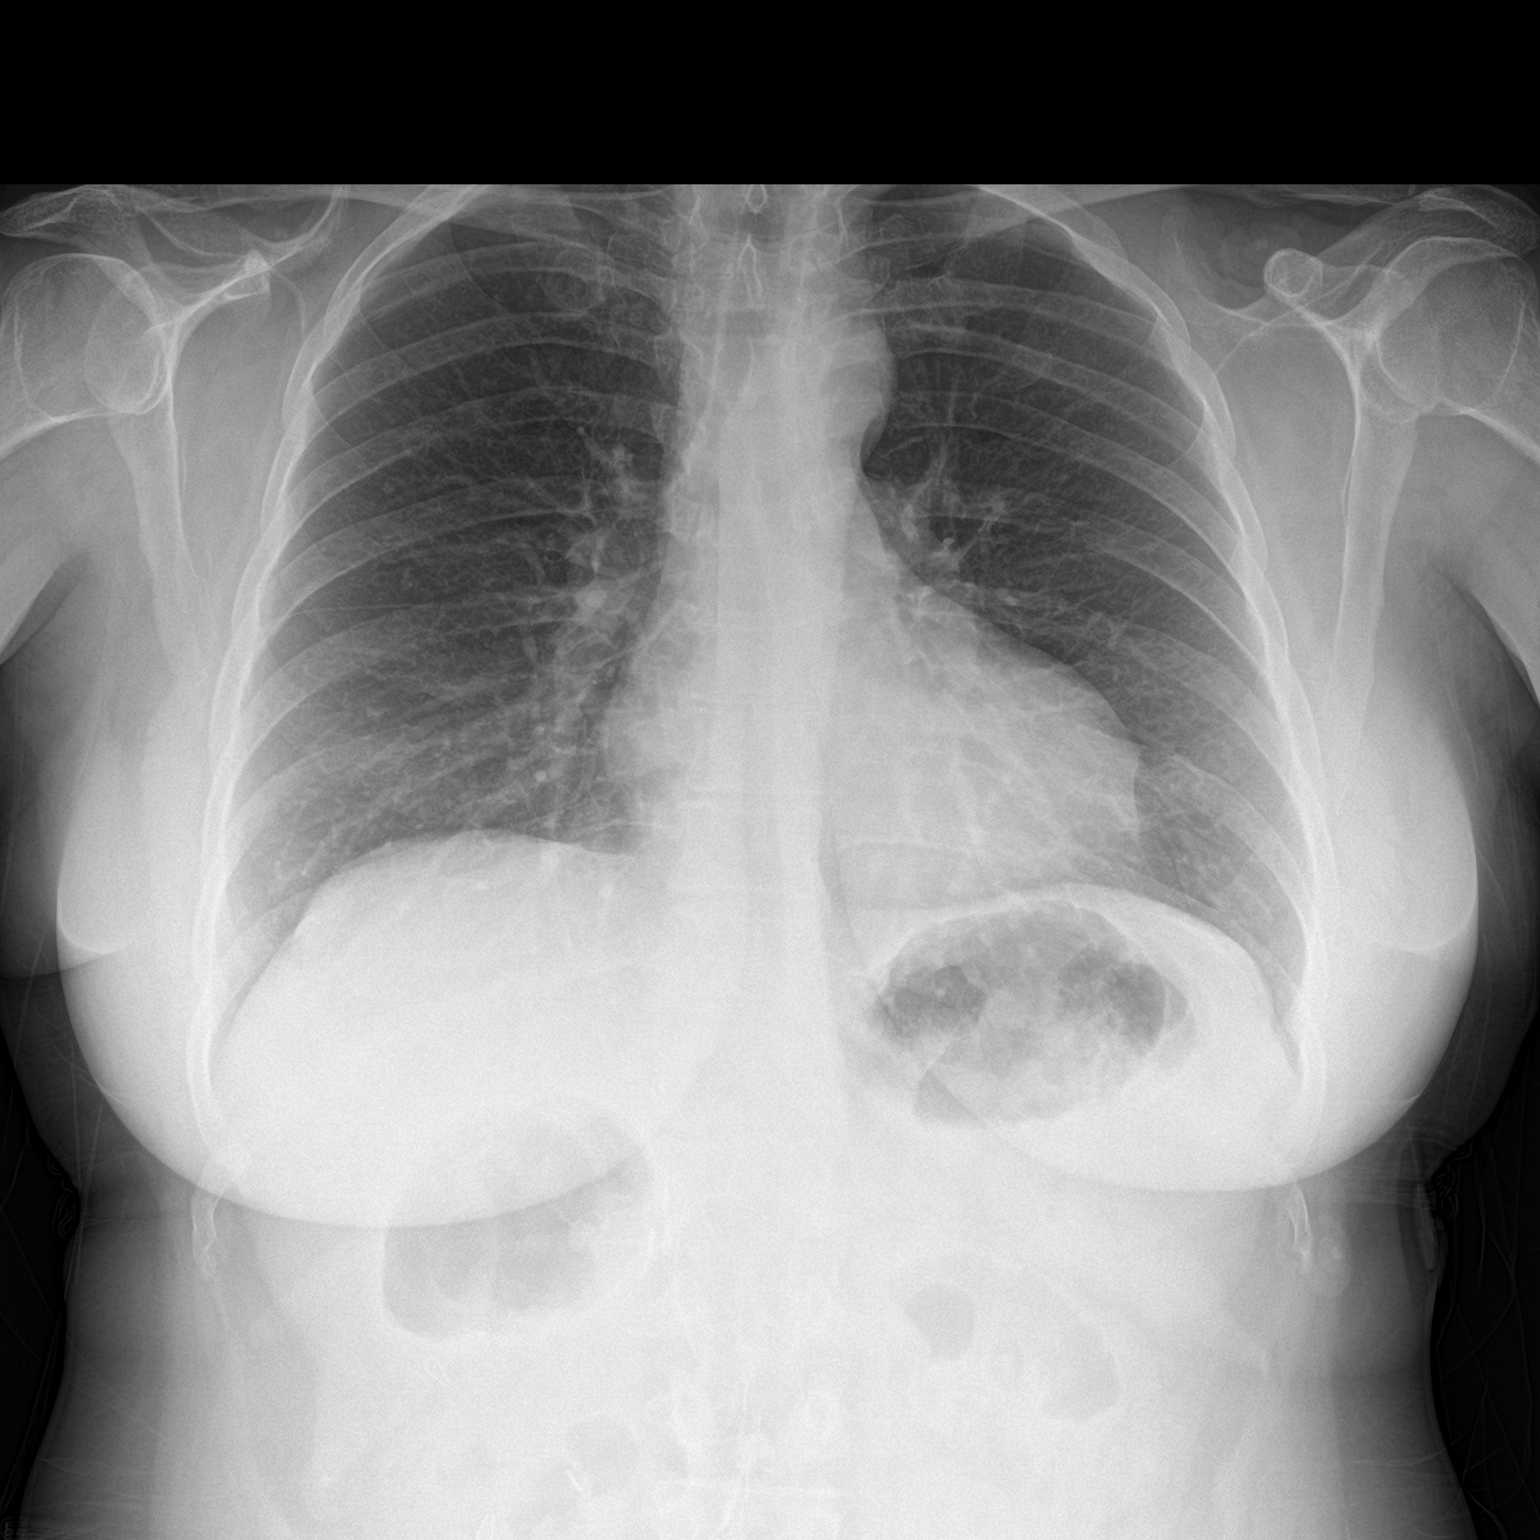

[chest lat]
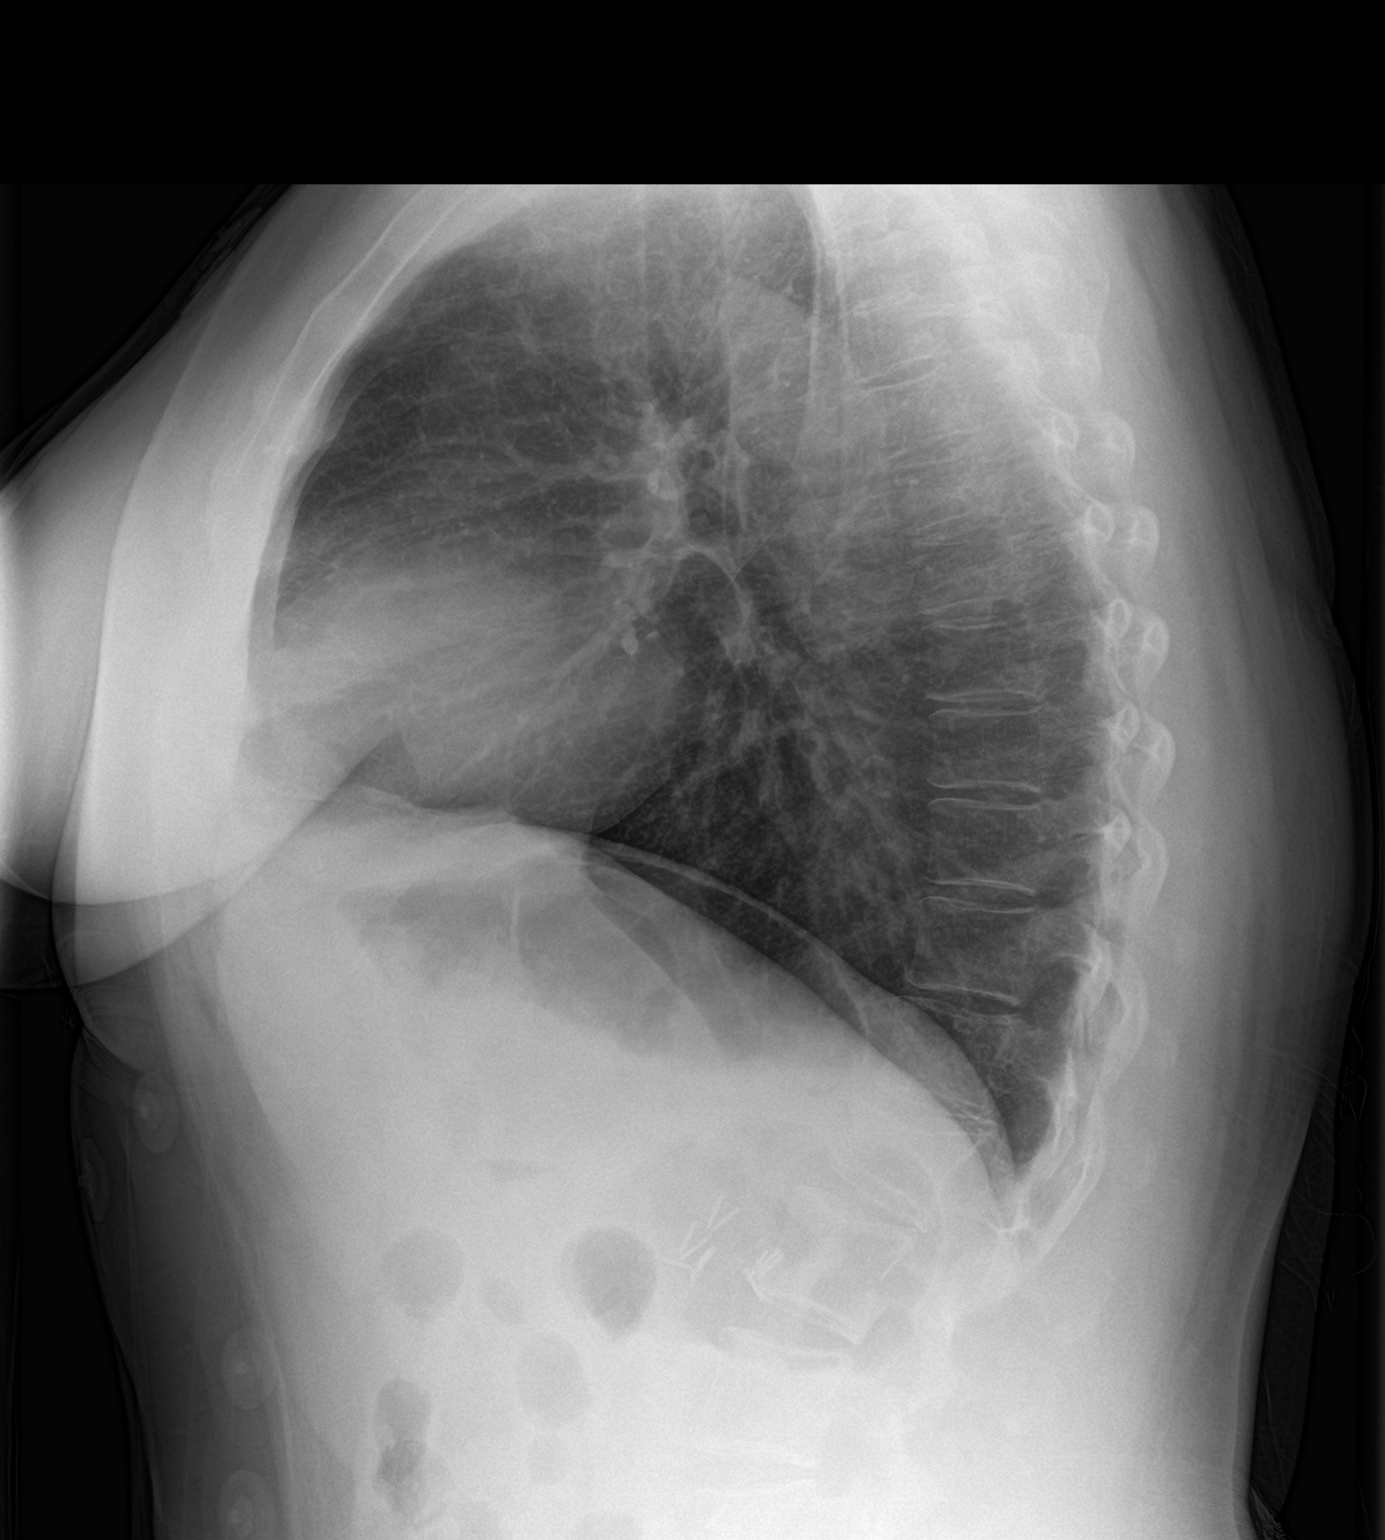

[2 of 2 positions shown; findings below may reference images not displayed]

FINDINGS: The heart size and mediastinal contours are within normal limits.
Both lungs are clear. The visualized skeletal structures are
unremarkable.
IMPRESSION: No active cardiopulmonary disease.

## 2021-04-06 ENCOUNTER — Ambulatory Visit: Payer: Self-pay

## 2021-04-06 NOTE — Telephone Encounter (Signed)
°  Chief Complaint: hypertension Symptoms: increased BP 200/99, unable to void since Friday, seeing floaters, headache, swelling Frequency: several days Pertinent Negatives: NA Disposition: [] ED /[] Urgent Care (no appt availability in office) / [] Appointment(In office/virtual)/ []  [place] [facility]/ [] Home Care/ [] Refused Recommended Disposition /[x] [place] [facility]/ []  Follow-up with PCP Additional Notes: Pt was advised to go to ED to be seen but states she went to [facility] ED last night and they didn't do anything and pt doesn't want go to to ED again if she doesn't have to.    Reason for Disposition  [0] Systolic BP  >= 388 OR Diastolic >= 828 AND [0] having NO cardiac or neurologic symptoms  Answer Assessment - Initial Assessment Questions 1. BLOOD PRESSURE: "What is the blood pressure?" "Did you take at least two measurements 5 minutes apart?"     200/99 2. ONSET: "When did you take your blood pressure?"     This morning 3. HOW: "How did you obtain the blood pressure?" (e.g., visiting nurse, automatic home BP monitor)     Automatic wrist cuff 4. HISTORY: "Do you have a history of high blood pressure?"     yes 5. MEDICATIONS: "Are you taking any medications for blood pressure?" "Have you missed any doses recently?"     No  6. OTHER SYMPTOMS: "Do you have any symptoms?" (e.g., headache, chest pain, blurred vision, difficulty breathing, weakness)     Floaters, confusion, headache, swelling  Protocols used: Blood Pressure - High-A-AH

## 2021-04-07 ENCOUNTER — Encounter (HOSPITAL_COMMUNITY): Payer: Self-pay

## 2021-04-07 ENCOUNTER — Other Ambulatory Visit: Payer: Self-pay

## 2021-04-07 ENCOUNTER — Emergency Department (HOSPITAL_COMMUNITY)
Admission: EM | Admit: 2021-04-07 | Discharge: 2021-04-07 | Disposition: A | Discharge status: Home / Self Care | Payer: Self-pay | Attending: Emergency Medicine | Admitting: Emergency Medicine

## 2021-04-07 ENCOUNTER — Ambulatory Visit: Payer: Self-pay | Admitting: *Deleted

## 2021-04-07 DIAGNOSIS — R1030 Lower abdominal pain, unspecified: Secondary | ICD-10-CM | POA: Insufficient documentation

## 2021-04-07 DIAGNOSIS — Z7984 Long term (current) use of oral hypoglycemic drugs: Secondary | ICD-10-CM | POA: Insufficient documentation

## 2021-04-07 DIAGNOSIS — R338 Other retention of urine: Secondary | ICD-10-CM

## 2021-04-07 DIAGNOSIS — R339 Retention of urine, unspecified: Secondary | ICD-10-CM | POA: Insufficient documentation

## 2021-04-07 DIAGNOSIS — I10 Essential (primary) hypertension: Secondary | ICD-10-CM | POA: Insufficient documentation

## 2021-04-07 DIAGNOSIS — Z79899 Other long term (current) drug therapy: Secondary | ICD-10-CM | POA: Insufficient documentation

## 2021-04-07 LAB — URINALYSIS, ROUTINE W REFLEX MICROSCOPIC
Bacteria, UA: NONE SEEN
Bilirubin Urine: NEGATIVE
Glucose, UA: NEGATIVE mg/dL
Ketones, ur: NEGATIVE mg/dL
Leukocytes,Ua: NEGATIVE
Nitrite: NEGATIVE
Protein, ur: NEGATIVE mg/dL
Specific Gravity, Urine: 1.009 (ref 1.005–1.030)
pH: 6 (ref 5.0–8.0)

## 2021-04-07 LAB — COMPREHENSIVE METABOLIC PANEL
ALT: 24 U/L (ref 0–44)
AST: 21 U/L (ref 15–41)
Albumin: 4.7 g/dL (ref 3.5–5.0)
Alkaline Phosphatase: 114 U/L (ref 38–126)
Anion gap: 8 (ref 5–15)
BUN: 18 mg/dL (ref 6–20)
CO2: 24 mmol/L (ref 22–32)
Calcium: 9.4 mg/dL (ref 8.9–10.3)
Chloride: 108 mmol/L (ref 98–111)
Creatinine, Ser: 1.2 mg/dL — ABNORMAL HIGH (ref 0.44–1.00)
GFR, Estimated: 56 mL/min — ABNORMAL LOW (ref >60)
Glucose, Bld: 191 mg/dL — ABNORMAL HIGH (ref 70–99)
Potassium: 3.8 mmol/L (ref 3.5–5.1)
Sodium: 140 mmol/L (ref 135–145)
Total Bilirubin: 0.4 mg/dL (ref 0.3–1.2)
Total Protein: 8 g/dL (ref 6.5–8.1)

## 2021-04-07 LAB — CBC WITH DIFFERENTIAL/PLATELET
Abs Immature Granulocytes: 0.02 10*3/uL (ref 0.00–0.07)
Basophils Absolute: 0.1 10*3/uL (ref 0.0–0.1)
Basophils Relative: 1 %
Eosinophils Absolute: 0.1 10*3/uL (ref 0.0–0.5)
Eosinophils Relative: 2 %
HCT: 42.7 % (ref 36.0–46.0)
Hemoglobin: 14.1 g/dL (ref 12.0–15.0)
Immature Granulocytes: 0 %
Lymphocytes Relative: 40 %
Lymphs Abs: 3.8 10*3/uL (ref 0.7–4.0)
MCH: 27.9 pg (ref 26.0–34.0)
MCHC: 33 g/dL (ref 30.0–36.0)
MCV: 84.6 fL (ref 80.0–100.0)
Monocytes Absolute: 0.4 10*3/uL (ref 0.1–1.0)
Monocytes Relative: 4 %
Neutro Abs: 5.2 10*3/uL (ref 1.7–7.7)
Neutrophils Relative %: 53 %
Platelets: 274 10*3/uL (ref 150–400)
RBC: 5.05 MIL/uL (ref 3.87–5.11)
RDW: 13.7 % (ref 11.5–15.5)
WBC: 9.6 10*3/uL (ref 4.0–10.5)
nRBC: 0 % (ref 0.0–0.2)

## 2021-04-07 NOTE — Telephone Encounter (Signed)
°  Chief Complaint: requesting permission to leave ED due to elevated BP and unable to urinate  Symptoms: abdominal distention, severe pain told by mobile unit to go to ED Frequency: pain since Friday  Pertinent Negatives: Patient denies na Disposition: [x] ED /[] Urgent Care (no appt availability in office) / [] Appointment(In office/virtual)/ []  [place] [facility]/ [] Home Care/ [] Refused Recommended Disposition /[] [place] [facility]/ []  Follow-up with PCP Additional Notes:  Called to request to leave ED due to wait time of 6 hours .  Advised patient to be seen now .  Called received from daughter in law to call ED due to patent's severe pain to have bladder emptied now .       Reason for Disposition  [1] Unable to urinate (or only a few drops) > 4 hours AND [2] bladder feels very full (e.g., palpable bladder or strong urge to urinate)  Answer Assessment - Initial Assessment Questions 1. SYMPTOM: "What's the main symptom you're concerned about?" (e.g., frequency, incontinence)     Unable to urinate 2. ONSET: "When did the  pain   start?"     Yesterday  3. PAIN: "Is there any pain?" If Yes, ask: "How bad is it?" (Scale: 1-10; mild, moderate, severe)     severe 4. CAUSE: "What do you think is causing the symptoms?"     Urinated 1 time since Friday . Abdominal distention 5. OTHER SYMPTOMS: "Do you have any other symptoms?" (e.g., fever, flank pain, blood in urine, pain with urination)     Elevated BP. Abdominal distention  6. PREGNANCY: "Is there any chance you are pregnant?" "When was your last menstrual period?"     na  Protocols used: Urinary Symptoms-A-AH

## 2021-04-07 NOTE — ED Triage Notes (Signed)
Patient states she last urinated 6 days ago and then states she gave 2 urine specimens yesterday, but has not voided any more within that time frame. Patient has abdominal swelling and pain x 6 days.

## 2021-04-07 NOTE — ED Notes (Signed)
Went into patient room to discharge. Patient and daughter adamantly urged that foley be removed. Explained to patient and her daughter the purpose of the foley and the risks of removing. They explained that they are from [facility] and are headed back tomorrow and do not want the foley because she doesn't know how long it will be before she can see a urologist. Insisted that she will go to an emergency department in [facility] when she gets there and have the foley replaced if needed. Per patient request, foley was removed.

## 2021-04-07 NOTE — ED Provider Triage Note (Signed)
Emergency Medicine Provider Triage Evaluation Note  Felicia Hendricks , a 46 y.o. female  was evaluated in triage.  Pt complains of nominal distention, urinary retention.  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  She was seen at [facility] regional 2 days ago for diarrhea that is been ongoing for 2 days.  She was sent here by her physician for a bladder scan and possible in and out catheter.  No sick contacts.  Review of Systems  Positive: Abdominal distention, urinary retention, diarrhea Negative: Fever, abdominal pain  Physical Exam  BP (!) 161/102 (BP Location: Left Arm)    Pulse 77    Temp 98.2 F (36.8 C) (Oral)    Resp 18    SpO2 100%  Gen:   Awake, no distress   Resp:  Normal effort  MSK:   Moves extremities without difficulty  Other:  Abdomen appears very distended.  Medical Decision Making  Medically screening exam initiated at 12:40 PM.  Appropriate orders placed.  Colleene Swarthout was informed that the remainder of the evaluation will be completed by another provider, this initial triage assessment does not replace that evaluation, and the importance of remaining in the ED until their evaluation is complete.  Patient bladder scan in triage was approximately 100 mL.,  Report abdominal distention.   Janeece Fitting, PA-C 04/07/21 1245

## 2021-04-07 NOTE — Discharge Instructions (Signed)
You have been evaluated for your symptoms.  A Foley catheter was placed.  Please call and follow-up closely with [facility] for reassessment of your symptom and removal of the Foley as appropriate.  Return to the ER if you have any concern.

## 2021-04-07 NOTE — ED Notes (Signed)
Pt was called for recheck vitals no response

## 2021-04-07 NOTE — ED Provider Notes (Signed)
[facility] DEPT Provider Note   CSN: [id number] Arrival date & time: 04/07/21  1155     History  Chief Complaint  Patient presents with   Urinary Retention    Felicia Hendricks is a 46 y.o. female.  The history is provided by the patient and medical records.   46 year old female with significant history of chronic back pain not on opiate, hypertension, opioid abuse, tobacco use presenting with concerns of urinary retention.  History obtained through patient and through daughter who is at bedside.  Patient report for the past 6 days she has had trouble with urinary retention.  States she has the urge to go but unable to produce any urine.  She feels very bloated with abdominal tightness.  Pain is moderate in intensity.  She denies any fever nausea vomiting worsening back pain leg numbness or leg weakness.  She denies any recent opiate use and states she has been clean for 2 years.  No recent medication changes.  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  Home Medications Prior to Admission medications   Medication Sig Start Date End Date Taking? Authorizing Provider  amLODipine (NORVASC) 10 MG tablet Take 1 tablet (10 mg total) by mouth daily. 10/07/18   Ladell Pier, MD  atorvastatin (LIPITOR) 20 MG tablet TAKE 1 TABLET (20 MG TOTAL) BY MOUTH DAILY. 11/14/18   Ladell Pier, MD  lisinopril (ZESTRIL) 5 MG tablet Take 1 tablet (5 mg total) by mouth daily. Patient not taking: Reported on 10/21/2019 10/07/18   Ladell Pier, MD  loperamide (IMODIUM A-D) 2 MG tablet Take 1 tablet (2 mg total) by mouth 3 (three) times daily as needed for diarrhea or loose stools. 04/02/18   Ladell Pier, MD  loratadine (CLARITIN) 10 MG tablet Take 1 tablet (10 mg total) by mouth daily. Patient not taking: Reported on 10/21/2019 04/12/18   Ladell Pier, MD   metFORMIN (GLUCOPHAGE) 500 MG tablet Take 0.5 tablets (250 mg total) by mouth daily with breakfast. 10/11/18   Ladell Pier, MD  METHADONE HCL PO Take 1 tablet by mouth daily. 145 mg    [provider]  sertraline (ZOLOFT) 100 MG tablet Take 1 tablet (100 mg total) by mouth daily. Must have office visit for refills. 02/05/19   Ladell Pier, MD  traZODone (DESYREL) 50 MG tablet Take 0.5-1 tablets (25-50 mg total) by mouth at bedtime as needed for sleep. 10/21/19   Mayers, Cari S, PA-C      Allergies    Aspirin    Review of Systems   Review of Systems  All other systems reviewed and are negative.  Physical Exam Updated Vital Signs BP (!) 155/113 (BP Location: Left Arm)    Pulse 83    Temp 98.2 F (36.8 C) (Oral)    Resp 20    Ht 5\' 3"  (1.6 m)    Wt 79.4 kg    SpO2 96%    BMI 31.00 kg/m  Physical Exam Vitals and nursing note reviewed.  Constitutional:      General: She is not in acute distress.    Appearance: She is well-developed.     Comments: Patient sitting in bed appears in no acute discomfort  HENT:     Head: Atraumatic.  Eyes:     Conjunctiva/sclera: Conjunctivae normal.  Cardiovascular:     Rate and  Rhythm: Normal rate and regular rhythm.  Pulmonary:     Effort: Pulmonary effort is normal.  Abdominal:     General: There is distension (Abdomen is distended with mild tenderness to suprapubic region.).  Musculoskeletal:     Cervical back: Neck supple.     Comments: 5 out of 5 strength bilateral lower extremities  Skin:    Findings: No rash.     Comments: Mottled skin appearance to bilateral lower extremity with intact pedal pulses.  Legs warm to the touch  Neurological:     Mental Status: She is alert and oriented to person, place, and time.  Psychiatric:        Mood and Affect: Mood normal.    ED Results / Procedures / Treatments   Labs (all labs ordered are listed, but only abnormal results are displayed) Labs Reviewed  COMPREHENSIVE  METABOLIC PANEL - Abnormal; Notable for the following components:      Result Value   Glucose, Bld 191 (*)    Creatinine, Ser 1.20 (*)    GFR, Estimated 56 (*)    All other components within normal limits  URINALYSIS, ROUTINE W REFLEX MICROSCOPIC - Abnormal; Notable for the following components:   Color, Urine STRAW (*)    Hgb urine dipstick MODERATE (*)    All other components within normal limits  CBC WITH DIFFERENTIAL/PLATELET    EKG None  Radiology No results found.  Procedures Procedures    Medications Ordered in ED Medications - No data to display  ED Course/ Medical Decision Making/ A&P                           Medical Decision Making  BP (!) 155/113 (BP Location: Left Arm)    Pulse 83    Temp 98.2 F (36.8 C) (Oral)    Resp 20    Ht 5\' 3"  (1.6 m)    Wt 79.4 kg    SpO2 96%    BMI 31.00 kg/m   6:46 PM This is a 47 year old female significant history of opioid abuse however reportedly has been clean from opiate use for at least 2 years.  She is here with acute urinary retention, report ongoing for the past 6 days.  Does have history of chronic back pain but denies any worsening back pain or weakness, and she is able to ambulate.  She does endorse abdominal distention from not being able to urinate.  She also mention being seen at an outside hospital earlier today for her complaint but was subsequently discharged home.  On exam she does have a distended abdomen with mild tenderness to the suprapubic region.  Bladder scan performed showing 500 cc of retained urine.  Will insert Foley.  Currently no red flags.  Labs ordered.  8:18 PM Labs and UA was independently reviewed interpreted by me.  Labs are reassuring, mild AKI with creatinine of 1.2 however it improved from prior value.  Urinalysis obtained shows moderate hemoglobin and urine dipsticks however no evidence of UTI.  Blood could be related to recent insertion of Foley catheter causing trauma.  At this time, patient  is stable to go home with a Foley and leg bag.  I encouraged patient to follow-up with urology in the next few days for reassessment of her acute urinary retention.  Low suspicion for drug-induced cause, or cauda equina.        Final Clinical Impression(s) / ED Diagnoses Final diagnoses:  Acute  urinary retention    Rx / DC Orders ED Discharge Orders     None         Domenic Moras, PA-C 04/07/21 2032    Fredia Sorrow, MD 04/10/21 [phone]

## 2021-04-07 NOTE — Telephone Encounter (Signed)
Patient states she had not voided in several days. Having diarrhea and BP is elevated.  Currently at [facility].  Advised to cal office for f/u apt.
